# Patient Record
Sex: Female | Born: 2000 | Race: White | Hispanic: No | Marital: Single | Smoking: Current every day smoker
Health system: Southern US, Community
[De-identification: ages and names within clinical notes are randomized; demographics above are authoritative.]

---

## 2018-08-10 ENCOUNTER — Other Ambulatory Visit: Payer: Self-pay | Admitting: Chiropractic Medicine

## 2018-08-10 DIAGNOSIS — M9901 Segmental and somatic dysfunction of cervical region: Secondary | ICD-10-CM

## 2018-08-12 ENCOUNTER — Ambulatory Visit
Admission: RE | Admit: 2018-08-12 | Discharge: 2018-08-12 | Disposition: A | Discharge status: Home / Self Care | Payer: BC Managed Care – PPO | Source: Ambulatory Visit | Attending: Chiropractic Medicine | Admitting: Chiropractic Medicine

## 2018-08-12 DIAGNOSIS — M9901 Segmental and somatic dysfunction of cervical region: Secondary | ICD-10-CM

## 2019-09-09 ENCOUNTER — Emergency Department: Payer: BC Managed Care – PPO

## 2019-09-09 ENCOUNTER — Encounter: Payer: Self-pay | Admitting: Emergency Medicine

## 2019-09-09 ENCOUNTER — Inpatient Hospital Stay
Admission: EM | Admit: 2019-09-09 | Discharge: 2019-09-11 | DRG: 872 | Disposition: A | Discharge status: Home / Self Care | Payer: BC Managed Care – PPO | Attending: Internal Medicine | Admitting: Internal Medicine

## 2019-09-09 ENCOUNTER — Other Ambulatory Visit: Payer: Self-pay

## 2019-09-09 DIAGNOSIS — R000 Tachycardia, unspecified: Secondary | ICD-10-CM | POA: Diagnosis present

## 2019-09-09 DIAGNOSIS — R112 Nausea with vomiting, unspecified: Secondary | ICD-10-CM | POA: Diagnosis present

## 2019-09-09 DIAGNOSIS — A419 Sepsis, unspecified organism: Secondary | ICD-10-CM | POA: Diagnosis not present

## 2019-09-09 DIAGNOSIS — N12 Tubulo-interstitial nephritis, not specified as acute or chronic: Secondary | ICD-10-CM

## 2019-09-09 DIAGNOSIS — Z20822 Contact with and (suspected) exposure to covid-19: Secondary | ICD-10-CM | POA: Diagnosis present

## 2019-09-09 DIAGNOSIS — F17290 Nicotine dependence, other tobacco product, uncomplicated: Secondary | ICD-10-CM | POA: Diagnosis present

## 2019-09-09 DIAGNOSIS — E876 Hypokalemia: Secondary | ICD-10-CM | POA: Diagnosis present

## 2019-09-09 DIAGNOSIS — N10 Acute tubulo-interstitial nephritis: Secondary | ICD-10-CM | POA: Diagnosis present

## 2019-09-09 DIAGNOSIS — Z87440 Personal history of urinary (tract) infections: Secondary | ICD-10-CM

## 2019-09-09 DIAGNOSIS — E860 Dehydration: Secondary | ICD-10-CM | POA: Diagnosis present

## 2019-09-09 DIAGNOSIS — E871 Hypo-osmolality and hyponatremia: Secondary | ICD-10-CM | POA: Diagnosis present

## 2019-09-09 DIAGNOSIS — N390 Urinary tract infection, site not specified: Secondary | ICD-10-CM | POA: Diagnosis present

## 2019-09-09 LAB — CBC WITH DIFFERENTIAL/PLATELET
Abs Immature Granulocytes: 0.04 10*3/uL (ref 0.00–0.07)
Basophils Absolute: 0 10*3/uL (ref 0.0–0.1)
Basophils Relative: 0 %
Eosinophils Absolute: 0 10*3/uL (ref 0.0–0.5)
Eosinophils Relative: 0 %
HCT: 35.8 % — ABNORMAL LOW (ref 36.0–46.0)
Hemoglobin: 12.5 g/dL (ref 12.0–15.0)
Immature Granulocytes: 0 %
Lymphocytes Relative: 11 %
Lymphs Abs: 1.4 10*3/uL (ref 0.7–4.0)
MCH: 29.8 pg (ref 26.0–34.0)
MCHC: 34.9 g/dL (ref 30.0–36.0)
MCV: 85.2 fL (ref 80.0–100.0)
Monocytes Absolute: 1.2 10*3/uL — ABNORMAL HIGH (ref 0.1–1.0)
Monocytes Relative: 9 %
Neutro Abs: 10.3 10*3/uL — ABNORMAL HIGH (ref 1.7–7.7)
Neutrophils Relative %: 80 %
Platelets: 141 10*3/uL — ABNORMAL LOW (ref 150–400)
RBC: 4.2 MIL/uL (ref 3.87–5.11)
RDW: 12.2 % (ref 11.5–15.5)
WBC: 12.9 10*3/uL — ABNORMAL HIGH (ref 4.0–10.5)
nRBC: 0 % (ref 0.0–0.2)

## 2019-09-09 LAB — COMPREHENSIVE METABOLIC PANEL
ALT: 35 U/L (ref 0–44)
AST: 33 U/L (ref 15–41)
Albumin: 4.2 g/dL (ref 3.5–5.0)
Alkaline Phosphatase: 77 U/L (ref 38–126)
Anion gap: 11 (ref 5–15)
BUN: 10 mg/dL (ref 6–20)
CO2: 21 mmol/L — ABNORMAL LOW (ref 22–32)
Calcium: 9 mg/dL (ref 8.9–10.3)
Chloride: 102 mmol/L (ref 98–111)
Creatinine, Ser: 0.83 mg/dL (ref 0.44–1.00)
GFR calc Af Amer: >60 mL/min (ref >60)
GFR calc non Af Amer: >60 mL/min (ref >60)
Glucose, Bld: 113 mg/dL — ABNORMAL HIGH (ref 70–99)
Potassium: 3.3 mmol/L — ABNORMAL LOW (ref 3.5–5.1)
Sodium: 134 mmol/L — ABNORMAL LOW (ref 135–145)
Total Bilirubin: 1.4 mg/dL — ABNORMAL HIGH (ref 0.3–1.2)
Total Protein: 7.4 g/dL (ref 6.5–8.1)

## 2019-09-09 LAB — URINALYSIS, COMPLETE (UACMP) WITH MICROSCOPIC
Bilirubin Urine: NEGATIVE
Glucose, UA: NEGATIVE mg/dL
Ketones, ur: 5 mg/dL — AB
Nitrite: NEGATIVE
Protein, ur: 100 mg/dL — AB
Specific Gravity, Urine: 1.012 (ref 1.005–1.030)
WBC, UA: >50 WBC/hpf — ABNORMAL HIGH (ref 0–5)
pH: 5 (ref 5.0–8.0)

## 2019-09-09 LAB — PROTIME-INR
INR: 1.1 (ref 0.8–1.2)
Prothrombin Time: 13.9 seconds (ref 11.4–15.2)

## 2019-09-09 LAB — LACTIC ACID, PLASMA: Lactic Acid, Venous: 0.9 mmol/L (ref 0.5–1.9)

## 2019-09-09 LAB — RESPIRATORY PANEL BY RT PCR (FLU A&B, COVID)
Influenza A by PCR: NEGATIVE
Influenza B by PCR: NEGATIVE
SARS Coronavirus 2 by RT PCR: NEGATIVE

## 2019-09-09 LAB — TROPONIN I (HIGH SENSITIVITY): Troponin I (High Sensitivity): 3 ng/L (ref <18)

## 2019-09-09 MED ORDER — POTASSIUM CHLORIDE IN NACL 40-0.9 MEQ/L-% IV SOLN
INTRAVENOUS | Status: AC
  Administered 2019-09-10 (×2): 125 mL/h via INTRAVENOUS
  Administered 2019-09-10 – 2019-09-11 (×2): 100 mL/h via INTRAVENOUS
  Filled 2019-09-09 (×4): qty 1000

## 2019-09-09 MED ORDER — SODIUM CHLORIDE 0.9 % IV SOLN
1.0000 g | INTRAVENOUS | Status: DC
  Administered 2019-09-10: 1 g via INTRAVENOUS
  Filled 2019-09-09: qty 10
  Filled 2019-09-09: qty 1

## 2019-09-09 MED ORDER — SODIUM CHLORIDE 0.9 % IV SOLN
1.0000 g | Freq: Once | INTRAVENOUS | Status: AC
  Administered 2019-09-09: 1 g via INTRAVENOUS
  Filled 2019-09-09: qty 10

## 2019-09-09 MED ORDER — SODIUM CHLORIDE 0.9 % IV BOLUS
1000.0000 mL | Freq: Once | INTRAVENOUS | Status: AC
  Administered 2019-09-09: 21:00:00 1000 mL via INTRAVENOUS

## 2019-09-09 MED ORDER — ACETAMINOPHEN 325 MG PO TABS
650.0000 mg | ORAL_TABLET | Freq: Four times a day (QID) | ORAL | Status: DC | PRN
  Administered 2019-09-10 – 2019-09-11 (×4): 650 mg via ORAL
  Filled 2019-09-09 (×4): qty 2

## 2019-09-09 MED ORDER — ONDANSETRON HCL 4 MG/2ML IJ SOLN
4.0000 mg | Freq: Three times a day (TID) | INTRAMUSCULAR | Status: DC | PRN
  Administered 2019-09-10: 4 mg via INTRAVENOUS
  Filled 2019-09-09: qty 2

## 2019-09-09 MED ORDER — ONDANSETRON HCL 4 MG/2ML IJ SOLN
4.0000 mg | Freq: Once | INTRAMUSCULAR | Status: AC
  Administered 2019-09-09: 4 mg via INTRAVENOUS
  Filled 2019-09-09: qty 2

## 2019-09-09 MED ORDER — ENOXAPARIN SODIUM 40 MG/0.4ML ~~LOC~~ SOLN
40.0000 mg | SUBCUTANEOUS | Status: DC
  Administered 2019-09-10 – 2019-09-11 (×2): 40 mg via SUBCUTANEOUS
  Filled 2019-09-09 (×2): qty 0.4

## 2019-09-09 MED ORDER — SODIUM CHLORIDE 0.9 % IV BOLUS
1000.0000 mL | Freq: Once | INTRAVENOUS | Status: AC
  Administered 2019-09-09: 1000 mL via INTRAVENOUS

## 2019-09-09 MED ORDER — ACETAMINOPHEN 500 MG PO TABS
1000.0000 mg | ORAL_TABLET | Freq: Once | ORAL | Status: AC
  Administered 2019-09-09: 1000 mg via ORAL
  Filled 2019-09-09: qty 2

## 2019-09-09 MED ORDER — ACETAMINOPHEN 650 MG RE SUPP: 650.0000 mg | Freq: Four times a day (QID) | RECTAL | Status: DC | PRN

## 2019-09-09 MED ORDER — OXYCODONE HCL 5 MG PO TABS
5.0000 mg | ORAL_TABLET | Freq: Once | ORAL | Status: AC
  Administered 2019-09-09: 5 mg via ORAL
  Filled 2019-09-09: qty 1

## 2019-09-09 MED ORDER — OXYCODONE HCL 5 MG PO TABS
5.0000 mg | ORAL_TABLET | Freq: Four times a day (QID) | ORAL | Status: DC | PRN
  Administered 2019-09-10 – 2019-09-11 (×3): 5 mg via ORAL
  Filled 2019-09-09 (×3): qty 1

## 2019-09-09 MED ORDER — INFLUENZA VAC SPLIT QUAD 0.5 ML IM SUSY: 0.5000 mL | PREFILLED_SYRINGE | INTRAMUSCULAR | Status: DC

## 2019-09-09 NOTE — ED Triage Notes (Signed)
Pt states that she was diagnosed with a UTI today at urgent care. Pt reports not being able to keep down anything including her antibiotic. Pt states that she was given Cipro. Pt is febrile and tachycardic at this time in triage.

## 2019-09-09 NOTE — H&P (Signed)
History and Physical        Hospital Admission Note Date: 09/09/2019  Patient name: Deborah Mcfarland Medical record number: [id number] Date of birth: 2000-11-20 Age: 19 y.o. Gender: female  PCP: Coralyn Helling, MD    Patient coming from: Home   I have reviewed all records in the [facility].    Chief Complaint:  Fever and UTI   HPI: Deborah Mcfarland is 19 y.o. female presenting to ER for fever and nausea/vomiting with recent diagnosis of UTI. Reports for past two days she has had dysuria, frequency and urgency. Went to doctor today and was given an antibiotic for UTI. However, she was unable to take it because she had recurrent vomiting. Fever to 103 degrees at home. Reports pain around her kidneys. Has a history of UTIs. Last one about 6 months ago. Reports having several. Never had a work up for this. Always resolves with antibiotics.    ED work-up/course:  Found to be tachycardic with temperature of 100.3. UA concerning for infection and dehydration. WBC 12.9. No lactic acidosis. Due to recurrent emesis, given IV Zofran and IV CTX. 2L NS IVF bolus given. Urine culture obtained.   Review of Systems: Positives marked in 'bold' Constitutional: Denies fever, chills, diaphoresis, poor appetite and fatigue.  HEENT: Denies photophobia, eye pain, redness, hearing loss, ear pain, congestion, sore throat, rhinorrhea, sneezing, mouth sores, trouble swallowing, neck pain, neck stiffness and tinnitus.   Respiratory: Denies SOB, DOE, cough, chest tightness,  and wheezing.   Cardiovascular: Denies chest pain, palpitations and leg swelling.  Gastrointestinal: Denies nausea, vomiting, abdominal pain, diarrhea, constipation, blood in stool and abdominal distention.  Genitourinary: Denies dysuria, urgency, frequency, hematuria, flank pain and difficulty urinating.  Musculoskeletal: Denies myalgias, back pain, joint swelling, arthralgias  and gait problem.  Skin: Denies pallor, rash and wound.  Neurological: Denies dizziness, seizures, syncope, weakness, light-headedness, numbness and headaches.  Hematological: Denies adenopathy. Easy bruising, personal or family bleeding history  Psychiatric/Behavioral: Denies suicidal ideation, mood changes, confusion, nervousness, sleep disturbance and agitation  Past Medical History: History reviewed. No pertinent past medical history.  History reviewed. No pertinent surgical history.  Medications: Prior to Admission medications   Medication Sig Start Date End Date Taking? Authorizing Provider  Cholecalciferol 50 MCG (2000 UT) CAPS Take 2,000 Units by mouth once a week.   Yes [provider]  ciprofloxacin (CIPRO) 500 MG tablet Take 500 mg by mouth 2 (two) times daily. 09/09/19  Yes [provider]  ibuprofen (ADVIL) 200 MG tablet Take 400 mg by mouth every 6 (six) hours as needed for fever.   Yes [provider]  sertraline (ZOLOFT) 50 MG tablet Take 50 mg by mouth daily.   Yes [provider]    Allergies:  No Known Allergies  Social History:  reports that she has been smoking e-cigarettes. She has never used smokeless tobacco. She reports that she does not drink alcohol or use drugs.  Family History: No family history on file.  Physical Exam: Blood pressure 103/66, pulse 86, temperature 100.3 F (37.9 C), temperature source Oral, resp. rate (!) 21, height 5\' 2"  (1.575 m), weight 52.6 kg, last menstrual period  08/26/2019, SpO2 98 %. General: Alert, awake, oriented x3, in no acute distress. Eyes: pink conjunctiva,anicteric sclera, pupils equal and reactive to light and accomodation, HEENT: normocephalic, atraumatic, oropharynx clear Neck: supple, no masses or lymphadenopathy, no goiter, no bruits, no JVD CVS: Mildly tachycardic and regular rhythm, without murmurs, rubs or gallops. No lower extremity edema Resp : Clear to auscultation  bilaterally, no wheezing, rales or rhonchi. GI : Soft, nontender, nondistended, positive bowel sounds, no masses. No hepatomegaly. No hernia.  Musculoskeletal: No clubbing or cyanosis, positive pedal pulses. No contracture. ROM intact. TTP over left flank.  Neuro: Grossly intact, no focal neurological deficits, strength 5/5 upper and lower extremities bilaterally Psych: alert and oriented x 3, normal mood and affect Skin: no rashes or lesions, warm and dry   LABS on Admission: I have personally reviewed all the labs and imagings below    Basic Metabolic Panel: Recent Labs  Lab 09/09/19 1930  NA 134*  K 3.3*  CL 102  CO2 21*  GLUCOSE 113*  BUN 10  CREATININE 0.83  CALCIUM 9.0   Liver Function Tests: Recent Labs  Lab 09/09/19 1930  AST 33  ALT 35  ALKPHOS 77  BILITOT 1.4*  PROT 7.4  ALBUMIN 4.2   No results for input(s): LIPASE, AMYLASE in the last 168 hours. No results for input(s): AMMONIA in the last 168 hours. CBC: Recent Labs  Lab 09/09/19 1930  WBC 12.9*  NEUTROABS 10.3*  HGB 12.5  HCT 35.8*  MCV 85.2  PLT 141*   Cardiac Enzymes: No results for input(s): CKTOTAL, CKMB, CKMBINDEX, TROPONINI in the last 168 hours. BNP: Invalid input(s): POCBNP CBG: No results for input(s): GLUCAP in the last 168 hours.  Radiological Exams on Admission:  DG Chest 2 View  Result Date: 09/09/2019 CLINICAL DATA:  Suspected sepsis. Urinary tract infection at urgent care. EXAM: CHEST - 2 VIEW COMPARISON:  None. FINDINGS: The cardiomediastinal contours are normal. The lungs are clear. Pulmonary vasculature is normal. No consolidation, pleural effusion, or pneumothorax. No acute osseous abnormalities are seen. IMPRESSION: Negative radiographs of the chest. Electronically Signed   By: Narda Rutherford M.D.   On: 09/09/2019 19:43      EKG: Independently reviewed. Sinus tachycardia.    Assessment/Plan Active Problems:   Pyelonephritis   Nausea and vomiting   Tachycardia    Hypokalemia   Hyponatremia  Pyelonephritis  Patient presenting with UTI symptoms; unable to take PO antibiotic prescribed today. Tachycardic at presentation, improved after 2L NS IVFs. T 100.3. Mild leukocytosis 12.9. No lactic acidosis. UA with large leukocytes, 11-20 RBCs, >50 WBCs, rare bacteria. No evidence of postrenal AKI that would suggest obstruction. Given last UTI 6 months ago, does not suggest she has had an antibiotic treatment failure.  -observation MedSurg  -given CTX 1g in ED, continue q24h  -Zofran prn  -Tylenol for pain/antipyretic; Oxycodone 4 mg q6h prn severe pain  -urine culture pending -blood culture pending  -switch to PO antibiotics pending ability to tolerate PO and sensitivities  -patient would likely benefit from outpatient urology given report of frequent UTIs   Hypokalemia/Hyponatremia  Mild. Na 134. K 3.3. Likely related to GI losses with vomiting.  -NS 125 cc hr with 40 mEq KCl x12 hrs  -repeat BMET in AM    DVT prophylaxis: Lovenox   CODE STATUS: FULL   Consults called: None   Family Communication: Admission, patients condition and plan of care including tests being ordered have been discussed with the patient who indicates  understanding and agree with the plan and Code Status. Declines me speaking to her family on her behalf.   Admission status: Observation   The medical decision making on this patient was of high complexity and the patient is at high risk for clinical deterioration, therefore this is a level 3 admission.  Severity of Illness:     Moderate  The appropriate patient status for this patient is OBSERVATION. Observation status is judged to be reasonable and necessary in order to provide the required intensity of service to ensure the patient's safety. The patient's presenting symptoms, physical exam findings, and initial radiographic and laboratory data in the context of their medical condition is felt to place them at decreased risk for  further clinical deterioration. Furthermore, it is anticipated that the patient will be medically stable for discharge from the hospital within 2 midnights of admission. The following factors support the patient status of observation.   " The patient's presenting symptoms include dysuria, flank pain, vomiting, fevers. " The physical exam findings include tachycardia, flank pain. " The initial radiographic and laboratory data are WBC 12.9, K 3.2, Na 134, UA concerning for infection.     Time Spent on Admission: 38 minutes      De Hollingshead D.O.  [facility] 09/09/2019, 10:52 PM

## 2019-09-09 NOTE — ED Provider Notes (Addendum)
[facility] Emergency Department Provider Note  ____________________________________________   First MD Initiated Contact with Patient 09/09/19 2030     (approximate)  I have reviewed the triage vital signs and the nursing notes.  History  Chief Complaint Fever and Urinary Tract Infection    HPI Deborah Mcfarland is a 19 y.o. female no significant medical hx who presents for UTI, nausea, vomiting. Patient states she has had cloudy urine, dysuria for several days now. Today, developed nausea, vomiting, chills, and low back pain. Went to UC and diagnosed with UTI and started on PO antibiotics but has been unable to keep anything down due to vomiting. Denies any chest pain, cough, SOB, or sick contacts. Has hx of pyelonephritis and states this feels similar, though less severe than prior episode. Reports bilateral flank pain, sharp/stabbing, moderate in severity, no radiation, no alleviating/aggravating components.    Past Medical Hx History reviewed. No pertinent past medical history.  Problem List There are no problems to display for this patient.   Past Surgical Hx History reviewed. No pertinent surgical history.  Medications Prior to Admission medications   Not on File    Allergies Patient has no known allergies.  Family Hx No family history on file.  Social Hx Social History   Tobacco Use  . Smoking status: Current Every Day Smoker    Types: E-cigarettes  . Smokeless tobacco: Never Used  Substance Use Topics  . Alcohol use: Never  . Drug use: Never     Review of Systems  Constitutional: + for fever, chills. Eyes: Negative for visual changes. ENT: Negative for sore throat. Cardiovascular: Negative for chest pain. Respiratory: Negative for shortness of breath. Gastrointestinal: Negative for nausea, vomiting.  Genitourinary: + for dysuria. Musculoskeletal: Negative for leg swelling. Skin: Negative for rash. Neurological: Negative  for headaches.   Physical Exam  Vital Signs: ED Triage Vitals  Enc Vitals Group     BP 09/09/19 1917 102/60     Pulse Rate 09/09/19 1917 (!) 134     Resp 09/09/19 1917 (!) 22     Temp 09/09/19 1917 100.3 F (37.9 C)     Temp Source 09/09/19 1917 Oral     SpO2 09/09/19 1917 (!) 2 %     Weight 09/09/19 1918 116 lb (52.6 kg)     Height 09/09/19 1918 5\' 2"  (1.575 m)     Head Circumference --      Peak Flow --      Pain Score 09/09/19 1917 7     Pain Loc --      Pain Edu? --      Excl. in GC? --     Constitutional: Alert and oriented.  Head: Normocephalic. Atraumatic. Eyes: Conjunctivae clear. Sclera anicteric. Nose: No congestion. No rhinorrhea. Mouth/Throat: Wearing mask.  Neck: No stridor.   Cardiovascular: Tachycardic, regular rhythm. Extremities well perfused. Respiratory: Normal respiratory effort.  Lungs CTAB. Gastrointestinal: Soft. Non-tender. Non-distended. Bilateral CVA tenderness. Musculoskeletal: No lower extremity edema. No deformities. Neurologic:  Normal speech and language. No gross focal neurologic deficits are appreciated.  Skin: Skin is warm, dry and intact. No rash noted. Psychiatric: Mood and affect are appropriate for situation.  EKG  Personally reviewed.   Rate: 123 Rhythm: sinus Axis: normal Intervals: WNL TWI inferiorly No STEMI    Radiology  CXR: IMPRESSION:  Negative radiographs of the chest.    Procedures  Procedure(s) performed (including critical care):  Procedures   Initial Impression / Assessment and Plan /  ED Course  19 y.o. female who presents to the ED for UTI, n/v, fever, unable to keep down PO abx  Ddx: UTI, pyelonephritis, sepsis, COVID  Temp 100.3 and tachycardic on arrival. Labs, fluids, antipyretics ordered.   UA overwhelmingly positive for infection. Lactic WNL. WBC 12.9. IVF and abx ordered. HR and temperature improving. Will plan to admit for IV abx, further symptom control to ensure successful  transition to PO.    Final Clinical Impression(s) / ED Diagnosis  Final diagnoses:  Pyelonephritis       Note:  This document was prepared using Dragon voice recognition software and may include unintentional dictation errors.     Lilia Pro., MD 09/10/19 [phone]

## 2019-09-09 NOTE — ED Notes (Signed)
First Nurse Note: Pt to ED for vomiting. Pt dx with UTI at urgent care but unable to keep it down.

## 2019-09-09 NOTE — ED Notes (Signed)
Attempt to call report, according to staff on floor need to obtain "a set of complete vital signs" before report can be given. Will do as requested and call back.

## 2019-09-10 DIAGNOSIS — E876 Hypokalemia: Secondary | ICD-10-CM | POA: Diagnosis present

## 2019-09-10 DIAGNOSIS — R112 Nausea with vomiting, unspecified: Secondary | ICD-10-CM

## 2019-09-10 DIAGNOSIS — A419 Sepsis, unspecified organism: Secondary | ICD-10-CM | POA: Diagnosis present

## 2019-09-10 DIAGNOSIS — E871 Hypo-osmolality and hyponatremia: Secondary | ICD-10-CM

## 2019-09-10 DIAGNOSIS — R000 Tachycardia, unspecified: Secondary | ICD-10-CM

## 2019-09-10 DIAGNOSIS — N10 Acute tubulo-interstitial nephritis: Secondary | ICD-10-CM | POA: Diagnosis present

## 2019-09-10 DIAGNOSIS — Z87440 Personal history of urinary (tract) infections: Secondary | ICD-10-CM | POA: Diagnosis not present

## 2019-09-10 DIAGNOSIS — Z20822 Contact with and (suspected) exposure to covid-19: Secondary | ICD-10-CM | POA: Diagnosis present

## 2019-09-10 DIAGNOSIS — E860 Dehydration: Secondary | ICD-10-CM | POA: Diagnosis present

## 2019-09-10 DIAGNOSIS — F17290 Nicotine dependence, other tobacco product, uncomplicated: Secondary | ICD-10-CM | POA: Diagnosis present

## 2019-09-10 DIAGNOSIS — N12 Tubulo-interstitial nephritis, not specified as acute or chronic: Secondary | ICD-10-CM | POA: Diagnosis not present

## 2019-09-10 LAB — CBC
HCT: 33.2 % — ABNORMAL LOW (ref 36.0–46.0)
Hemoglobin: 11.1 g/dL — ABNORMAL LOW (ref 12.0–15.0)
MCH: 29.8 pg (ref 26.0–34.0)
MCHC: 33.4 g/dL (ref 30.0–36.0)
MCV: 89.2 fL (ref 80.0–100.0)
Platelets: 125 10*3/uL — ABNORMAL LOW (ref 150–400)
RBC: 3.72 MIL/uL — ABNORMAL LOW (ref 3.87–5.11)
RDW: 12.5 % (ref 11.5–15.5)
WBC: 10 10*3/uL (ref 4.0–10.5)
nRBC: 0 % (ref 0.0–0.2)

## 2019-09-10 LAB — BASIC METABOLIC PANEL
Anion gap: 6 (ref 5–15)
BUN: 9 mg/dL (ref 6–20)
CO2: 26 mmol/L (ref 22–32)
Calcium: 7.9 mg/dL — ABNORMAL LOW (ref 8.9–10.3)
Chloride: 107 mmol/L (ref 98–111)
Creatinine, Ser: 0.63 mg/dL (ref 0.44–1.00)
GFR calc Af Amer: >60 mL/min (ref >60)
GFR calc non Af Amer: >60 mL/min (ref >60)
Glucose, Bld: 105 mg/dL — ABNORMAL HIGH (ref 70–99)
Potassium: 3.8 mmol/L (ref 3.5–5.1)
Sodium: 139 mmol/L (ref 135–145)

## 2019-09-10 MED ORDER — PROMETHAZINE HCL 25 MG/ML IJ SOLN
12.5000 mg | Freq: Four times a day (QID) | INTRAMUSCULAR | Status: DC | PRN
  Administered 2019-09-10: 18:00:00 12.5 mg via INTRAVENOUS
  Filled 2019-09-10: qty 1

## 2019-09-10 MED ORDER — ONDANSETRON HCL 4 MG/2ML IJ SOLN
4.0000 mg | Freq: Four times a day (QID) | INTRAMUSCULAR | Status: DC | PRN
  Administered 2019-09-10: 4 mg via INTRAVENOUS
  Filled 2019-09-10: qty 2

## 2019-09-10 MED ORDER — MORPHINE SULFATE (PF) 2 MG/ML IV SOLN: 1.0000 mg | INTRAVENOUS | Status: DC | PRN

## 2019-09-10 NOTE — Progress Notes (Signed)
PROGRESS NOTE    Deborah Mcfarland  IDP:[id number] DOB: [birth year] DOA: 09/09/2019 PCP: Chauncey Fischer, MD   Brief Narrative:  Deborah Mcfarland is 19 y.o. female presenting to ER for fever and nausea/vomiting with recent diagnosis of UTI. Reports for past two days she has had dysuria, frequency and urgency. Went to doctor today and was given an antibiotic for UTI. However, she was unable to take it because she had recurrent vomiting. Fever to 103 degrees at home. Reports pain around her kidneys. Has a history of UTIs. Last one about 6 months ago. Reports having several. Never had a work up for this. Always resolves with antibiotics.    ED work-up/course:  Found to be tachycardic with temperature of 100.3. UA concerning for infection and dehydration. WBC 12.9. No lactic acidosis. Due to recurrent emesis, given IV Zofran and IV CTX. 2L NS IVF bolus given. Urine culture obtained.    Assessment & Plan:   Active Problems:   Pyelonephritis   Nausea and vomiting   Tachycardia   Hypokalemia   Hyponatremia   Pyelonephritis  As noted on admission, patient presenting with UTI symptoms; unable to take PO antibiotic prescribed today. Tachycardic at presentation, improved after 2L NS IVFs. T 100.3. Mild leukocytosis 12.9. No lactic acidosis. UA with large leukocytes, 11-20 RBCs, >50 WBCs, rare bacteria. No evidence of postrenal AKI that would suggest obstruction. -Inpatient admission MedSurg secondary to required IV antibiotics and IV pain medications with persistent nausea vomiting abdominal pain -given CTX 1g in ED, continue q24h  -Zofran prn  -Tylenol for pain/antipyretic; Oxycodone 4 mg q6h prn severe pain  -urine culture pending -blood culture negative  -patient would likely benefit from outpatient urology given report of frequent UTIs   Hypokalemia/Hyponatremia  Mild. Na 134. K 3.3. Likely related to GI losses with vomiting.  -NS 125 cc hr with 40 mEq KCl x12 hrs  -repeat BMET in AM     DVT prophylaxis: Lovenox SQ  Code Status: Full code    Code Status Orders  (From admission, onward)         Start     Ordered   09/10/19 0000  Full code  Continuous     09/09/19 2359        Code Status History    This patient has a current code status but no historical code status.   Advance Care Planning Activity     Family Communication: Discussed in detail with patient Disposition Plan:   Patient was changed to inpatient requiring IV antiemetics for persistent nausea and vomiting in the setting of pyelonephritis.  Patient also required fluid resuscitation secondary to decreased p.o. intake. Consults called: None Admission status: Inpatient   Consultants:   None  Procedures:  DG Chest 2 View  Result Date: 09/09/2019 CLINICAL DATA:  Suspected sepsis. Urinary tract infection at urgent care. EXAM: CHEST - 2 VIEW COMPARISON:  None. FINDINGS: The cardiomediastinal contours are normal. The lungs are clear. Pulmonary vasculature is normal. No consolidation, pleural effusion, or pneumothorax. No acute osseous abnormalities are seen. IMPRESSION: Negative radiographs of the chest. Electronically Signed   By: Narda Rutherford M.D.   On: 09/09/2019 19:43     Antimicrobials:   Ceftriaxone day #2   Subjective: Patient with nausea and vomiting overnight, Unable to keep anything down.  Objective: Vitals:   09/10/19 0602 09/10/19 0807 09/10/19 1034 09/10/19 1211  BP: 100/70 108/77 (!) 89/44 104/65  Pulse: 82 98 (!) 106 (!) 101  Resp:  18  18  Temp:  98.6 F (37 C) 100.2 F (37.9 C) 99.6 F (37.6 C)  TempSrc:  Oral Oral Oral  SpO2:  100% 99% 96%  Weight:      Height:        Intake/Output Summary (Last 24 hours) at 09/10/2019 1238 Last data filed at 09/10/2019 0602 Gross per 24 hour  Intake 1589.3 ml  Output 400 ml  Net 1189.3 ml   Filed Weights   09/09/19 1918 09/09/19 2337  Weight: 52.6 kg 54.5 kg    Examination:  General exam: Appears uncomfortable  no acute hemodynamic instability Respiratory system: Clear to auscultation. Respiratory effort normal. Cardiovascular system: S1 & S2 heard, RRR. No JVD, murmurs, rubs, gallops or clicks. No pedal edema. Gastrointestinal system: Abdomen is nondistended, soft and nontender. No organomegaly or masses felt. Normal bowel sounds heard. Central nervous system: Alert and oriented. No focal neurological deficits. Extremities: wwp, nv intact Skin: No rashes, lesions or ulcers Psychiatry: Judgement and insight appear normal. Mood & affect appropriate.     Data Reviewed: I have personally reviewed following labs and imaging studies  CBC: Recent Labs  Lab 09/09/19 1930 09/10/19 0454  WBC 12.9* 10.0  NEUTROABS 10.3*  --   HGB 12.5 11.1*  HCT 35.8* 33.2*  MCV 85.2 89.2  PLT 141* 125*   Basic Metabolic Panel: Recent Labs  Lab 09/09/19 1930 09/10/19 0454  NA 134* 139  K 3.3* 3.8  CL 102 107  CO2 21* 26  GLUCOSE 113* 105*  BUN 10 9  CREATININE 0.83 0.63  CALCIUM 9.0 7.9*   GFR: Estimated Creatinine Clearance: 88.7 mL/min (by C-G formula based on SCr of 0.63 mg/dL). Liver Function Tests: Recent Labs  Lab 09/09/19 1930  AST 33  ALT 35  ALKPHOS 77  BILITOT 1.4*  PROT 7.4  ALBUMIN 4.2   No results for input(s): LIPASE, AMYLASE in the last 168 hours. No results for input(s): AMMONIA in the last 168 hours. Coagulation Profile: Recent Labs  Lab 09/09/19 1930  INR 1.1   Cardiac Enzymes: No results for input(s): CKTOTAL, CKMB, CKMBINDEX, TROPONINI in the last 168 hours. BNP (last 3 results) No results for input(s): PROBNP in the last 8760 hours. HbA1C: No results for input(s): HGBA1C in the last 72 hours. CBG: No results for input(s): GLUCAP in the last 168 hours. Lipid Profile: No results for input(s): CHOL, HDL, LDLCALC, TRIG, CHOLHDL, LDLDIRECT in the last 72 hours. Thyroid Function Tests: No results for input(s): TSH, T4TOTAL, FREET4, T3FREE, THYROIDAB in the last 72  hours. Anemia Panel: No results for input(s): VITAMINB12, FOLATE, FERRITIN, TIBC, IRON, RETICCTPCT in the last 72 hours. Sepsis Labs: Recent Labs  Lab 09/09/19 1930  LATICACIDVEN 0.9    Recent Results (from the past 240 hour(s))  Culture, blood (Routine x 2)     Status: None (Preliminary result)   Collection Time: 09/09/19  7:30 PM   Specimen: BLOOD  Result Value Ref Range Status   Specimen Description BLOOD RIGHT FOREARM  Final   Special Requests   Final    BOTTLES DRAWN AEROBIC AND ANAEROBIC Blood Culture adequate volume   Culture   Final    NO GROWTH < 12 HOURS Performed at [facility], [address] [place], [place] [address]    Report Status PENDING  Incomplete  Culture, blood (Routine x 2)     Status: None (Preliminary result)   Collection Time: 09/09/19  8:58 PM   Specimen: BLOOD  Result Value Ref Range  Status   Specimen Description BLOOD RIGHT HAND  Final   Special Requests   Final    BOTTLES DRAWN AEROBIC AND ANAEROBIC Blood Culture adequate volume   Culture   Final    NO GROWTH < 12 HOURS Performed at [facility], [address] [place], [place] [address]    Report Status PENDING  Incomplete  Respiratory Panel by RT PCR (Flu A&B, Covid) - Nasopharyngeal Swab     Status: None   Collection Time: 09/09/19  8:58 PM   Specimen: Nasopharyngeal Swab  Result Value Ref Range Status   SARS Coronavirus 2 by RT PCR NEGATIVE NEGATIVE Final    Comment: (NOTE) SARS-CoV-2 target nucleic acids are NOT DETECTED. The SARS-CoV-2 RNA is generally detectable in upper respiratoy specimens during the acute phase of infection. The lowest concentration of SARS-CoV-2 viral copies this assay can detect is 131 copies/mL. A negative result does not preclude SARS-Cov-2 infection and should not be used as the sole basis for treatment or other patient management decisions. A negative result may occur with  improper specimen collection/handling, submission of specimen  other than nasopharyngeal swab, presence of viral mutation(s) within the areas targeted by this assay, and inadequate number of viral copies (<131 copies/mL). A negative result must be combined with clinical observations, patient history, and epidemiological information. The expected result is Negative. Fact Sheet for Patients:  PinkCheek.be Fact Sheet for Healthcare Providers:  GravelBags.it This test is not yet ap proved or cleared by the [place] FDA and  has been authorized for detection and/or diagnosis of SARS-CoV-2 by FDA under an Emergency Use Authorization (EUA). This EUA will remain  in effect (meaning this test can be used) for the duration of the COVID-19 declaration under Section 564(b)(1) of the Act, 21 U.S.C. section 360bbb-3(b)(1), unless the authorization is terminated or revoked sooner.    Influenza A by PCR NEGATIVE NEGATIVE Final   Influenza B by PCR NEGATIVE NEGATIVE Final    Comment: (NOTE) The Xpert Xpress SARS-CoV-2/FLU/RSV assay is intended as an aid in  the diagnosis of influenza from Nasopharyngeal swab specimens and  should not be used as a sole basis for treatment. Nasal washings and  aspirates are unacceptable for Xpert Xpress SARS-CoV-2/FLU/RSV  testing. Fact Sheet for Patients: PinkCheek.be Fact Sheet for Healthcare Providers: GravelBags.it This test is not yet approved or cleared by the [place] FDA and  has been authorized for detection and/or diagnosis of SARS-CoV-2 by  FDA under an Emergency Use Authorization (EUA). This EUA will remain  in effect (meaning this test can be used) for the duration of the  Covid-19 declaration under Section 564(b)(1) of the Act, 21  U.S.C. section 360bbb-3(b)(1), unless the authorization is  terminated or revoked. Performed at [facility], [address] [place], [place]  [address]          Radiology Studies: DG Chest 2 View  Result Date: 09/09/2019 CLINICAL DATA:  Suspected sepsis. Urinary tract infection at urgent care. EXAM: CHEST - 2 VIEW COMPARISON:  None. FINDINGS: The cardiomediastinal contours are normal. The lungs are clear. Pulmonary vasculature is normal. No consolidation, pleural effusion, or pneumothorax. No acute osseous abnormalities are seen. IMPRESSION: Negative radiographs of the chest. Electronically Signed   By: Keith Rake M.D.   On: 09/09/2019 19:43        Scheduled Meds: . enoxaparin (LOVENOX) injection  40 mg Subcutaneous Q24H  . influenza vac split quadrivalent PF  0.5 mL Intramuscular Tomorrow-1000   Continuous  Infusions: . 0.9 % NaCl with KCl 40 mEq / L 100 mL/hr (09/10/19 1211)  . cefTRIAXone (ROCEPHIN)  IV       LOS: 0 days    Time spent: 35 min    Burke Keels, MD [facility]  If 7PM-7AM, please contact night-coverage  09/10/2019, 12:38 PM

## 2019-09-11 DIAGNOSIS — N10 Acute tubulo-interstitial nephritis: Secondary | ICD-10-CM

## 2019-09-11 DIAGNOSIS — N390 Urinary tract infection, site not specified: Secondary | ICD-10-CM

## 2019-09-11 DIAGNOSIS — A419 Sepsis, unspecified organism: Principal | ICD-10-CM

## 2019-09-11 HISTORY — DX: Acute pyelonephritis: N10

## 2019-09-11 HISTORY — DX: Sepsis, unspecified organism: A41.9

## 2019-09-11 HISTORY — DX: Urinary tract infection, site not specified: N39.0

## 2019-09-11 LAB — CBC WITH DIFFERENTIAL/PLATELET
Abs Immature Granulocytes: 0.04 10*3/uL (ref 0.00–0.07)
Basophils Absolute: 0 10*3/uL (ref 0.0–0.1)
Basophils Relative: 0 %
Eosinophils Absolute: 0 10*3/uL (ref 0.0–0.5)
Eosinophils Relative: 0 %
HCT: 31.9 % — ABNORMAL LOW (ref 36.0–46.0)
Hemoglobin: 10.6 g/dL — ABNORMAL LOW (ref 12.0–15.0)
Immature Granulocytes: 0 %
Lymphocytes Relative: 30 %
Lymphs Abs: 2.7 10*3/uL (ref 0.7–4.0)
MCH: 29.6 pg (ref 26.0–34.0)
MCHC: 33.2 g/dL (ref 30.0–36.0)
MCV: 89.1 fL (ref 80.0–100.0)
Monocytes Absolute: 0.9 10*3/uL (ref 0.1–1.0)
Monocytes Relative: 10 %
Neutro Abs: 5.3 10*3/uL (ref 1.7–7.7)
Neutrophils Relative %: 60 %
Platelets: 128 10*3/uL — ABNORMAL LOW (ref 150–400)
RBC: 3.58 MIL/uL — ABNORMAL LOW (ref 3.87–5.11)
RDW: 12.6 % (ref 11.5–15.5)
WBC: 9 10*3/uL (ref 4.0–10.5)
nRBC: 0 % (ref 0.0–0.2)

## 2019-09-11 LAB — BASIC METABOLIC PANEL
Anion gap: 7 (ref 5–15)
BUN: 5 mg/dL — ABNORMAL LOW (ref 6–20)
CO2: 24 mmol/L (ref 22–32)
Calcium: 8.2 mg/dL — ABNORMAL LOW (ref 8.9–10.3)
Chloride: 109 mmol/L (ref 98–111)
Creatinine, Ser: 0.68 mg/dL (ref 0.44–1.00)
GFR calc Af Amer: >60 mL/min (ref >60)
GFR calc non Af Amer: >60 mL/min (ref >60)
Glucose, Bld: 104 mg/dL — ABNORMAL HIGH (ref 70–99)
Potassium: 4.2 mmol/L (ref 3.5–5.1)
Sodium: 140 mmol/L (ref 135–145)

## 2019-09-11 LAB — URINE CULTURE: Culture: NO GROWTH

## 2019-09-11 LAB — POCT PREGNANCY, URINE: Preg Test, Ur: NEGATIVE

## 2019-09-11 MED ORDER — CEPHALEXIN 500 MG PO CAPS: 500.0000 mg | ORAL_CAPSULE | Freq: Two times a day (BID) | ORAL | 0 refills | Status: AC

## 2019-09-11 NOTE — Progress Notes (Signed)
Deborah Mcfarland to be D/C'd Home per MD order.  Discussed prescriptions and follow up appointments with the patient. Prescriptions given to patient, medication list explained in detail. Pt verbalized understanding.  Allergies as of 09/11/2019   No Known Allergies     Medication List    STOP taking these medications   ciprofloxacin 500 MG tablet Commonly known as: CIPRO     TAKE these medications   cephALEXin 500 MG capsule Commonly known as: KEFLEX Take 1 capsule (500 mg total) by mouth 2 (two) times daily for 8 days.   Cholecalciferol 50 MCG (2000 UT) Caps Take 2,000 Units by mouth once a week.   ibuprofen 200 MG tablet Commonly known as: ADVIL Take 400 mg by mouth every 6 (six) hours as needed for fever.   sertraline 50 MG tablet Commonly known as: ZOLOFT Take 50 mg by mouth daily.       Vitals:   09/11/19 0826 09/11/19 0829  BP: (!) 88/55 96/60  Pulse: 74 65  Resp: 16 16  Temp: 98.4 F (36.9 C)   SpO2: 100% 100%    Skin clean, dry and intact without evidence of skin break down, no evidence of skin tears noted. IV catheter discontinued intact. Site without signs and symptoms of complications. Dressing and pressure applied. Pt denies pain at this time. No complaints noted.  An After Visit Summary was printed and given to the patient. Patient D/C home via private auto.  [name] 09/11/2019 12:59 PM

## 2019-09-11 NOTE — Discharge Instructions (Signed)
Pyelonephritis, Adult  Pyelonephritis is an infection that occurs in the kidney. The kidneys are organs that help clean the blood by moving waste out of the blood and into the pee (urine). This infection can happen quickly, or it can last for a long time. In most cases, it clears up with treatment and does not cause other problems. What are the causes? This condition may be caused by:  Germs (bacteria) going from the bladder up to the kidney. This may happen after having a bladder infection.  Germs going from the blood to the kidney. What increases the risk? This condition is more likely to develop in:  Pregnant women.  Older people.  People who have any of these conditions: ? Diabetes. ? Inflammation of the prostate gland (prostatitis), in males. ? Kidney stones or bladder stones. ? Other problems with the kidney or the parts of your body that carry pee from the kidneys to the bladder (ureters). ? Cancer.  People who have a small, thin tube (catheter) placed in the bladder.  People who are sexually active.  Women who use a medicine that kills sperm (spermicide) to prevent pregnancy.  People who have had a prior urinary tract infection (UTI). What are the signs or symptoms? Symptoms of this condition include:  Peeing often.  A strong urge to pee right away.  Burning or stinging when peeing.  Belly pain.  Back pain.  Pain in the side (flank area).  Fever or chills.  Blood in the pee, or dark pee.  Feeling sick to your stomach (nauseous) or throwing up (vomiting). How is this treated? This condition may be treated by:  Taking antibiotic medicines by mouth (orally).  Drinking enough fluids. If the infection is bad, you may need to stay in the hospital. You may be given antibiotics and fluids that are put directly into a vein through an IV tube. In some cases, other treatments may be needed. Follow these instructions at home: Medicines  Take your antibiotic  medicine as told by your doctor. Do not stop taking the antibiotic even if you start to feel better.  Take over-the-counter and prescription medicines only as told by your doctor. General instructions   Drink enough fluid to keep your pee pale yellow.  Avoid caffeine, tea, and carbonated drinks.  Pee (urinate) often. Avoid holding in pee for long periods of time.  Pee before and after sex.  After pooping (having a bowel movement), women should wipe from front to back. Use each tissue only once.  Keep all follow-up visits as told by your doctor. This is important. Contact a doctor if:  You do not feel better after 2 days.  Your symptoms get worse.  You have a fever. Get help right away if:  You cannot take your medicine or drink fluids as told.  You have chills and shaking.  You throw up.  You have very bad pain in your side or back.  You feel very weak or you pass out (faint). Summary  Pyelonephritis is an infection that occurs in the kidney.  In most cases, this infection clears up with treatment and does not cause other problems.  Take your antibiotic medicine as told by your doctor. Do not stop taking the antibiotic even if you start to feel better.  Drink enough fluid to keep your pee pale yellow. This information is not intended to replace advice given to you by your health care provider. Make sure you discuss any questions you have with  your health care provider. Document Revised: 05/10/2018 Document Reviewed: 05/10/2018 Elsevier Patient Education  2020 [employer]    Urinary Tract Infection, Adult  A urinary tract infection (UTI) is an infection of any part of the urinary tract. The urinary tract includes the kidneys, ureters, bladder, and urethra. These organs make, store, and get rid of urine in the body. Your health care provider may use other names to describe the infection. An upper UTI affects the ureters and kidneys (pyelonephritis). A lower UTI  affects the bladder (cystitis) and urethra (urethritis). What are the causes? Most urinary tract infections are caused by bacteria in your genital area, around the entrance to your urinary tract (urethra). These bacteria grow and cause inflammation of your urinary tract. What increases the risk? You are more likely to develop this condition if:  You have a urinary catheter that stays in place (indwelling).  You are not able to control when you urinate or have a bowel movement (you have incontinence).  You are female and you: ? Use a spermicide or diaphragm for birth control. ? Have low estrogen levels. ? Are pregnant.  You have certain genes that increase your risk (genetics).  You are sexually active.  You take antibiotic medicines.  You have a condition that causes your flow of urine to slow down, such as: ? An enlarged prostate, if you are female. ? Blockage in your urethra (stricture). ? A kidney stone. ? A nerve condition that affects your bladder control (neurogenic bladder). ? Not getting enough to drink, or not urinating often.  You have certain medical conditions, such as: ? Diabetes. ? A weak disease-fighting system (immunesystem). ? Sickle cell disease. ? Gout. ? Spinal cord injury. What are the signs or symptoms? Symptoms of this condition include:  Needing to urinate right away (urgently).  Frequent urination or passing small amounts of urine frequently.  Pain or burning with urination.  Blood in the urine.  Urine that smells bad or unusual.  Trouble urinating.  Cloudy urine.  Vaginal discharge, if you are female.  Pain in the abdomen or the lower back. You may also have:  Vomiting or a decreased appetite.  Confusion.  Irritability or tiredness.  A fever.  Diarrhea. The first symptom in older adults may be confusion. In some cases, they may not have any symptoms until the infection has worsened. How is this diagnosed? This condition is  diagnosed based on your medical history and a physical exam. You may also have other tests, including:  Urine tests.  Blood tests.  Tests for sexually transmitted infections (STIs). If you have had more than one UTI, a cystoscopy or imaging studies may be done to determine the cause of the infections. How is this treated? Treatment for this condition includes:  Antibiotic medicine.  Over-the-counter medicines to treat discomfort.  Drinking enough water to stay hydrated. If you have frequent infections or have other conditions such as a kidney stone, you may need to see a health care provider who specializes in the urinary tract (urologist). In rare cases, urinary tract infections can cause sepsis. Sepsis is a life-threatening condition that occurs when the body responds to an infection. Sepsis is treated in the hospital with IV antibiotics, fluids, and other medicines. Follow these instructions at home:  Medicines  Take over-the-counter and prescription medicines only as told by your health care provider.  If you were prescribed an antibiotic medicine, take it as told by your health care provider. Do not stop using  the antibiotic even if you start to feel better. General instructions  Make sure you: ? Empty your bladder often and completely. Do not hold urine for long periods of time. ? Empty your bladder after sex. ? Wipe from front to back after a bowel movement if you are female. Use each tissue one time when you wipe.  Drink enough fluid to keep your urine pale yellow.  Keep all follow-up visits as told by your health care provider. This is important. Contact a health care provider if:  Your symptoms do not get better after 1-2 days.  Your symptoms go away and then return. Get help right away if you have:  Severe pain in your back or your lower abdomen.  A fever.  Nausea or vomiting. Summary  A urinary tract infection (UTI) is an infection of any part of the  urinary tract, which includes the kidneys, ureters, bladder, and urethra.  Most urinary tract infections are caused by bacteria in your genital area, around the entrance to your urinary tract (urethra).  Treatment for this condition often includes antibiotic medicines.  If you were prescribed an antibiotic medicine, take it as told by your health care provider. Do not stop using the antibiotic even if you start to feel better.  Keep all follow-up visits as told by your health care provider. This is important. This information is not intended to replace advice given to you by your health care provider. Make sure you discuss any questions you have with your health care provider. Document Revised: 06/23/2018 Document Reviewed: 01/13/2018 Elsevier Patient Education  2020 [employer].

## 2019-09-11 NOTE — Discharge Summary (Signed)
Physician Discharge Summary  Deborah Mcfarland QZR:[id number] DOB: [birth year] DOA: 09/09/2019  PCP: Chauncey Fischer, MD  Admit date: 09/09/2019 Discharge date: 09/11/2019  Admitted From: Home Disposition: Home  Recommendations for Outpatient Follow-up:  1. Follow up with PCP in 1-2 weeks 2. Patient will complete total 10-day course of antibiotic after 3/1  Home Health: None Equipment/Devices: None  Discharge Condition: Fair CODE STATUS: Full code Diet recommendation: Regular    Discharge Diagnoses:  Principal Problem:   Acute pyelonephritis  Active Problems:   Nausea and vomiting   Tachycardia   Hypokalemia   Hyponatremia   Sepsis secondary to UTI [facility])  Brief narrative/HPI 19 year old female presented to the ED with ongoing nausea and vomiting associated with dysuria, flank pain, urinary frequency and urgency.  She was prescribed ciprofloxacin by her PCP for UTI but could not keep the antibiotic due to nausea and vomiting. She reports fever 103F at home and reports having frequent UTIs (last episode was 6 months ago).  Reports UTI to be triggered frequently after sexual intercourse.  Reports monogamous relationship with her boyfriend and had a protracted intercourse prior to her current symptoms. In the ED patient was tachycardic and had fever of 100.3 F,.  Clinically appeared dehydrated.  UA positive for UTI and WBC of 12.9K.  Also had low normal blood pressure.  Admitted for UTI with acute pyelonephritis. Patient tested negative for COVID-19.  Hospital course  Principal problem Sepsis secondary to UTI [facility]) Acute pyelonephritis Admitted to medical floor with IV hydration (received bolus on admission), empiric IV Rocephin, supportive care with pain medication and antiemetic. Patient has improved clinically and sepsis resolved.  No further flank pain or urinary symptoms.  Nausea and vomiting resolved and tolerating diet.  Afebrile past 24 hours and blood pressure stable as  well.  Blood culture and urine culture without any growth.  I will discharge her on oral Keflex to complete 10-day course of antibiotic empirically.  Provided instructions on keeping herself hydrated, frequent urination and protected intercourse with pre and post coital bladder emptying in order to prevent recurrent UTIs.   Active problems  Hypokalemia/hyponatremia Secondary to GI loss.  Replenished.  Hypotension Secondary to sepsis Blood pressure soft but asymptomatic.  Received IV fluids.  Encouraged p.o. fluid intake.  Procedure: None Family communication: None at bedside Disposition: Home   Discharge Instructions   Allergies as of 09/11/2019   No Known Allergies     Medication List    STOP taking these medications   ciprofloxacin 500 MG tablet Commonly known as: CIPRO     TAKE these medications   cephALEXin 500 MG capsule Commonly known as: KEFLEX Take 1 capsule (500 mg total) by mouth 2 (two) times daily for 8 days.   Cholecalciferol 50 MCG (2000 UT) Caps Take 2,000 Units by mouth once a week.   ibuprofen 200 MG tablet Commonly known as: ADVIL Take 400 mg by mouth every 6 (six) hours as needed for fever.   sertraline 50 MG tablet Commonly known as: ZOLOFT Take 50 mg by mouth daily.      Follow-up Information    Bean, Delila Pereyra, MD. Schedule an appointment as soon as possible for a visit in 1 week(s).   Specialty: Family Medicine Contact information: [address] [place] [address] [phone]          No Known Allergies    Procedures/Studies: DG Chest 2 View  Result Date: 09/09/2019 CLINICAL DATA:  Suspected sepsis. Urinary tract infection at urgent  care. EXAM: CHEST - 2 VIEW COMPARISON:  None. FINDINGS: The cardiomediastinal contours are normal. The lungs are clear. Pulmonary vasculature is normal. No consolidation, pleural effusion, or pneumothorax. No acute osseous abnormalities are seen. IMPRESSION: Negative radiographs of the  chest. Electronically Signed   By: Keith Rake M.D.   On: 09/09/2019 19:43     Subjective: No further nausea or vomiting.  Feels better and tolerating diet.  Denies any dysuria or urinary frequency/urgency.  Discharge Exam: Vitals:   09/11/19 0826 09/11/19 0829  BP: (!) 88/55 96/60  Pulse: 74 65  Resp: 16 16  Temp: 98.4 F (36.9 C)   SpO2: 100% 100%   Vitals:   09/10/19 2009 09/11/19 0427 09/11/19 0826 09/11/19 0829  BP: 99/60 (!) 89/58 (!) 88/55 96/60  Pulse: 99 73 74 65  Resp: 18 16 16 16   Temp: 99.5 F (37.5 C) 99.3 F (37.4 C) 98.4 F (36.9 C)   TempSrc: Oral Oral Oral   SpO2: 98% 98% 100% 100%  Weight:      Height:        General: Young female not in distress HEENT: Moist mucosa, supple neck Chest: Clear bilaterally CVs: S1-S2, GI: Soft, nondistended, nontender, no CVA tenderness Musculoskeletal: Warm, no edema    The results of significant diagnostics from this hospitalization (including imaging, microbiology, ancillary and laboratory) are listed below for reference.     Microbiology: Recent Results (from the past 240 hour(s))  Culture, blood (Routine x 2)     Status: None (Preliminary result)   Collection Time: 09/09/19  7:30 PM   Specimen: BLOOD  Result Value Ref Range Status   Specimen Description BLOOD RIGHT FOREARM  Final   Special Requests   Final    BOTTLES DRAWN AEROBIC AND ANAEROBIC Blood Culture adequate volume   Culture   Final    NO GROWTH 2 DAYS Performed at [facility], [address] [place], [place] [address]    Report Status PENDING  Incomplete  Urine Culture     Status: None   Collection Time: 09/09/19  8:50 PM   Specimen: Urine, Random  Result Value Ref Range Status   Specimen Description   Final    URINE, RANDOM Performed at [facility], [address] [place], [place] [address]    Special Requests   Final    NONE Performed at [facility], [address] [place], [place] [address]     Culture   Final    NO GROWTH Performed at [facility], [employer] [address] [place], [place] [address]    Report Status 09/11/2019 FINAL  Final  Culture, blood (Routine x 2)     Status: None (Preliminary result)   Collection Time: 09/09/19  8:58 PM   Specimen: BLOOD  Result Value Ref Range Status   Specimen Description BLOOD RIGHT HAND  Final   Special Requests   Final    BOTTLES DRAWN AEROBIC AND ANAEROBIC Blood Culture adequate volume   Culture   Final    NO GROWTH 2 DAYS Performed at [facility], [address] [place], [place] [address]    Report Status PENDING  Incomplete  Respiratory Panel by RT PCR (Flu A&B, Covid) - Nasopharyngeal Swab     Status: None   Collection Time: 09/09/19  8:58 PM   Specimen: Nasopharyngeal Swab  Result Value Ref Range Status   SARS Coronavirus 2 by RT PCR NEGATIVE NEGATIVE Final    Comment: (NOTE) SARS-CoV-2  target nucleic acids are NOT DETECTED. The SARS-CoV-2 RNA is generally detectable in upper respiratoy specimens during the acute phase of infection. The lowest concentration of SARS-CoV-2 viral copies this assay can detect is 131 copies/mL. A negative result does not preclude SARS-Cov-2 infection and should not be used as the sole basis for treatment or other patient management decisions. A negative result may occur with  improper specimen collection/handling, submission of specimen other than nasopharyngeal swab, presence of viral mutation(s) within the areas targeted by this assay, and inadequate number of viral copies (<131 copies/mL). A negative result must be combined with clinical observations, patient history, and epidemiological information. The expected result is Negative. Fact Sheet for Patients:  https://www.moore.com/ Fact Sheet for Healthcare Providers:  https://www.young.biz/ This test is not yet ap proved or cleared by the [place] FDA and  has been authorized for  detection and/or diagnosis of SARS-CoV-2 by FDA under an Emergency Use Authorization (EUA). This EUA will remain  in effect (meaning this test can be used) for the duration of the COVID-19 declaration under Section 564(b)(1) of the Act, 21 U.S.C. section 360bbb-3(b)(1), unless the authorization is terminated or revoked sooner.    Influenza A by PCR NEGATIVE NEGATIVE Final   Influenza B by PCR NEGATIVE NEGATIVE Final    Comment: (NOTE) The Xpert Xpress SARS-CoV-2/FLU/RSV assay is intended as an aid in  the diagnosis of influenza from Nasopharyngeal swab specimens and  should not be used as a sole basis for treatment. Nasal washings and  aspirates are unacceptable for Xpert Xpress SARS-CoV-2/FLU/RSV  testing. Fact Sheet for Patients: https://www.moore.com/ Fact Sheet for Healthcare Providers: https://www.young.biz/ This test is not yet approved or cleared by the [place] FDA and  has been authorized for detection and/or diagnosis of SARS-CoV-2 by  FDA under an Emergency Use Authorization (EUA). This EUA will remain  in effect (meaning this test can be used) for the duration of the  Covid-19 declaration under Section 564(b)(1) of the Act, 21  U.S.C. section 360bbb-3(b)(1), unless the authorization is  terminated or revoked. Performed at [facility], [address] [place], [place] [address]      Labs: BNP (last 3 results) No results for input(s): BNP in the last 8760 hours. Basic Metabolic Panel: Recent Labs  Lab 09/09/19 1930 09/10/19 0454 09/11/19 0433  NA 134* 139 140  K 3.3* 3.8 4.2  CL 102 107 109  CO2 21* 26 24  GLUCOSE 113* 105* 104*  BUN 10 9 5*  CREATININE 0.83 0.63 0.68  CALCIUM 9.0 7.9* 8.2*   Liver Function Tests: Recent Labs  Lab 09/09/19 1930  AST 33  ALT 35  ALKPHOS 77  BILITOT 1.4*  PROT 7.4  ALBUMIN 4.2   No results for input(s): LIPASE, AMYLASE in the last 168 hours. No results for  input(s): AMMONIA in the last 168 hours. CBC: Recent Labs  Lab 09/09/19 1930 09/10/19 0454 09/11/19 0433  WBC 12.9* 10.0 9.0  NEUTROABS 10.3*  --  5.3  HGB 12.5 11.1* 10.6*  HCT 35.8* 33.2* 31.9*  MCV 85.2 89.2 89.1  PLT 141* 125* 128*   Cardiac Enzymes: No results for input(s): CKTOTAL, CKMB, CKMBINDEX, TROPONINI in the last 168 hours. BNP: Invalid input(s): POCBNP CBG: No results for input(s): GLUCAP in the last 168 hours. D-Dimer No results for input(s): DDIMER in the last 72 hours. Hgb A1c No results for input(s): HGBA1C in the last 72 hours. Lipid Profile No results for input(s): CHOL, HDL, LDLCALC, TRIG, CHOLHDL,  LDLDIRECT in the last 72 hours. Thyroid function studies No results for input(s): TSH, T4TOTAL, T3FREE, THYROIDAB in the last 72 hours.  Invalid input(s): FREET3 Anemia work up No results for input(s): VITAMINB12, FOLATE, FERRITIN, TIBC, IRON, RETICCTPCT in the last 72 hours. Urinalysis    Component Value Date/Time   COLORURINE YELLOW (A) 09/09/2019 2050   APPEARANCEUR CLOUDY (A) 09/09/2019 2050   LABSPEC 1.012 09/09/2019 2050   PHURINE 5.0 09/09/2019 2050   GLUCOSEU NEGATIVE 09/09/2019 2050   HGBUR SMALL (A) 09/09/2019 2050   BILIRUBINUR NEGATIVE 09/09/2019 2050   KETONESUR 5 (A) 09/09/2019 2050   PROTEINUR 100 (A) 09/09/2019 2050   NITRITE NEGATIVE 09/09/2019 2050   LEUKOCYTESUR LARGE (A) 09/09/2019 2050   Sepsis Labs Invalid input(s): PROCALCITONIN,  WBC,  LACTICIDVEN Microbiology Recent Results (from the past 240 hour(s))  Culture, blood (Routine x 2)     Status: None (Preliminary result)   Collection Time: 09/09/19  7:30 PM   Specimen: BLOOD  Result Value Ref Range Status   Specimen Description BLOOD RIGHT FOREARM  Final   Special Requests   Final    BOTTLES DRAWN AEROBIC AND ANAEROBIC Blood Culture adequate volume   Culture   Final    NO GROWTH 2 DAYS Performed at [facility], [address] [place], [place] [address]     Report Status PENDING  Incomplete  Urine Culture     Status: None   Collection Time: 09/09/19  8:50 PM   Specimen: Urine, Random  Result Value Ref Range Status   Specimen Description   Final    URINE, RANDOM Performed at [facility], [address], [place], [place] [address]    Special Requests   Final    NONE Performed at [facility], [address] [place], [place] [address]    Culture   Final    NO GROWTH Performed at [facility], [address], [place], [place] [address]    Report Status 09/11/2019 FINAL  Final  Culture, blood (Routine x 2)     Status: None (Preliminary result)   Collection Time: 09/09/19  8:58 PM   Specimen: BLOOD  Result Value Ref Range Status   Specimen Description BLOOD RIGHT HAND  Final   Special Requests   Final    BOTTLES DRAWN AEROBIC AND ANAEROBIC Blood Culture adequate volume   Culture   Final    NO GROWTH 2 DAYS Performed at [facility], [address], [place], [place] [address]    Report Status PENDING  Incomplete  Respiratory Panel by RT PCR (Flu A&B, Covid) - Nasopharyngeal Swab     Status: None   Collection Time: 09/09/19  8:58 PM   Specimen: Nasopharyngeal Swab  Result Value Ref Range Status   SARS Coronavirus 2 by RT PCR NEGATIVE NEGATIVE Final    Comment: (NOTE) SARS-CoV-2 target nucleic acids are NOT DETECTED. The SARS-CoV-2 RNA is generally detectable in upper respiratoy specimens during the acute phase of infection. The lowest concentration of SARS-CoV-2 viral copies this assay can detect is 131 copies/mL. A negative result does not preclude SARS-Cov-2 infection and should not be used as the sole basis for treatment or other patient management decisions. A negative result may occur with  improper specimen collection/handling, submission of specimen other than nasopharyngeal swab, presence of viral mutation(s) within the areas targeted by this assay, and inadequate number of viral  copies (<131 copies/mL). A negative result must be combined with clinical observations, patient  history, and epidemiological information. The expected result is Negative. Fact Sheet for Patients:  https://www.moore.com/ Fact Sheet for Healthcare Providers:  https://www.young.biz/ This test is not yet ap proved or cleared by the [place] FDA and  has been authorized for detection and/or diagnosis of SARS-CoV-2 by FDA under an Emergency Use Authorization (EUA). This EUA will remain  in effect (meaning this test can be used) for the duration of the COVID-19 declaration under Section 564(b)(1) of the Act, 21 U.S.C. section 360bbb-3(b)(1), unless the authorization is terminated or revoked sooner.    Influenza A by PCR NEGATIVE NEGATIVE Final   Influenza B by PCR NEGATIVE NEGATIVE Final    Comment: (NOTE) The Xpert Xpress SARS-CoV-2/FLU/RSV assay is intended as an aid in  the diagnosis of influenza from Nasopharyngeal swab specimens and  should not be used as a sole basis for treatment. Nasal washings and  aspirates are unacceptable for Xpert Xpress SARS-CoV-2/FLU/RSV  testing. Fact Sheet for Patients: https://www.moore.com/ Fact Sheet for Healthcare Providers: https://www.young.biz/ This test is not yet approved or cleared by the [place] FDA and  has been authorized for detection and/or diagnosis of SARS-CoV-2 by  FDA under an Emergency Use Authorization (EUA). This EUA will remain  in effect (meaning this test can be used) for the duration of the  Covid-19 declaration under Section 564(b)(1) of the Act, 21  U.S.C. section 360bbb-3(b)(1), unless the authorization is  terminated or revoked. Performed at [facility], [address] [place], [place] [address]      Time coordinating discharge: 35 minutes  SIGNED:   Eddie North, MD  [facility] 09/11/2019, 12:20 PM Pager    If 7PM-7AM, please contact night-coverage [email or web address] Password [email or web address]

## 2019-09-12 LAB — HIV ANTIBODY (ROUTINE TESTING W REFLEX): HIV Screen 4th Generation wRfx: NONREACTIVE — AB

## 2019-09-14 LAB — CULTURE, BLOOD (ROUTINE X 2)
Culture: NO GROWTH
Culture: NO GROWTH
Special Requests: ADEQUATE
Special Requests: ADEQUATE

## 2020-01-09 ENCOUNTER — Other Ambulatory Visit: Payer: Self-pay

## 2020-01-09 ENCOUNTER — Ambulatory Visit (INDEPENDENT_AMBULATORY_CARE_PROVIDER_SITE_OTHER): Payer: BC Managed Care – PPO | Admitting: Obstetrics

## 2020-01-09 ENCOUNTER — Encounter: Payer: Self-pay | Admitting: Obstetrics

## 2020-01-09 VITALS — BP 100/60 | Wt 119.0 lb

## 2020-01-09 DIAGNOSIS — Z3201 Encounter for pregnancy test, result positive: Secondary | ICD-10-CM | POA: Diagnosis not present

## 2020-01-09 DIAGNOSIS — Z87718 Personal history of other specified (corrected) congenital malformations of genitourinary system: Secondary | ICD-10-CM

## 2020-01-09 DIAGNOSIS — Z3480 Encounter for supervision of other normal pregnancy, unspecified trimester: Secondary | ICD-10-CM

## 2020-01-09 DIAGNOSIS — Z3A01 Less than 8 weeks gestation of pregnancy: Secondary | ICD-10-CM

## 2020-01-09 DIAGNOSIS — Z3481 Encounter for supervision of other normal pregnancy, first trimester: Secondary | ICD-10-CM

## 2020-01-09 LAB — POCT URINALYSIS DIPSTICK OB: Glucose, UA: NEGATIVE

## 2020-01-09 LAB — POCT URINE PREGNANCY: Preg Test, Ur: POSITIVE — AB

## 2020-01-09 NOTE — Progress Notes (Signed)
New Obstetric Patient H&P    Chief Complaint: "Desires prenatal care"   History of Present Illness: Patient is a 19 y.o. G1P0 Not Hispanic or Latino female, LMP 11/20/2019 presents with amenorrhea and positive home pregnancy test. Based on her  LMP, her EDD is Estimated Date of Delivery: 08/26/20 and her EGA is [redacted]w[redacted]d. Cycles are 5. days, regular, and occur approximately every : 28 days. Her last pap smear was not done previously due to her age . Marland Kitchen    She had a urine pregnancy test which was positive about 4 days day(s)  ago. Her last menstrual period was normal and lasted for  about 4 day(s). Since her LMP she claims she has experienced nausea and fatigue. She denies vaginal bleeding. 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  There are cats in the home in the home  no   She admits close contact with children on a regular basis  no  She has had chicken pox in the past unknown She has had Tuberculosis exposures, symptoms, or previously tested positive for TB   no Current or past history of domestic violence. no  Genetic Screening/Teratology Counseling: (Includes patient, baby's father, or anyone in either family with:)   1. Patient's age >/= 60 at [facility]  no 2. Thalassemia ([place], [place], Mediterranean, or Asian background): MCV<80  no 3. Neural tube defect (meningomyelocele, spina bifida, anencephaly)  no 4. Congenital heart defect  no  5. Down syndrome  no 6. Tay-Sachs (Jewish, [place]  no 7. Canavan's Disease  no 8. Sickle cell disease or trait (African)  no  9. Hemophilia or other blood disorders  no  10. Muscular dystrophy  no  11. Cystic fibrosis  no  12. Huntington's Chorea  no  13. Mental retardation/autism  no 14. Other inherited  genetic or chromosomal disorder  no 15. Maternal metabolic disorder (DM, PKU, etc)  no 16. Patient or FOB with a child with a birth defect not listed above no  16a. Patient or FOB with a birth defect themselves no 17. Recurrent pregnancy loss, or stillbirth  no  18. Any medications since LMP other than prenatal vitamins (include vitamins, supplements, OTC meds, drugs, alcohol)  No, she stopped taking her daily Zoloft, and stopped smoking cigarettes. 19. Any other genetic/environmental exposure to discuss  no  Infection History:   1. Lives with someone with TB or TB exposed  no  2. Patient or partner has history of genital herpes  no 3. Rash or viral illness since LMP  no 4. History of STI (GC, CT, HPV, syphilis, HIV)  no 5. History of recent travel :  no  Other pertinent information:  yes     Review of Systems:10 point review of systems negative unless otherwise noted in HPI  Past Medical History:  History reviewed. No pertinent past medical history.  Past Surgical History:  History reviewed. No pertinent surgical history.  Gynecologic History: Patient's last menstrual period was 11/20/2019.  Obstetric History: G1P0  Family History:  History reviewed. No pertinent family history.  Social History:  Social History   Socioeconomic History  . Marital status: Single    Spouse name: Not on file  .  Number of children: Not on file  . Years of education: Not on file  . Highest education level: Not on file  Occupational History  . Not on file  Tobacco Use  . Smoking status: Current Every Day Smoker    Types: E-cigarettes  . Smokeless tobacco: Never Used  Vaping Use  . Vaping Use: Former  Substance and Sexual Activity  . Alcohol use: Never  . Drug use: Never  . Sexual activity: Yes    Birth control/protection: None  Other Topics Concern  . Not on file  Social History Narrative  . Not on file   Social Determinants of Health   Financial Resource Strain:   .  Difficulty of Paying Living Expenses:   Food Insecurity:   . Worried About Programme researcher, broadcasting/film/video in the Last Year:   . Barista in the Last Year:   Transportation Needs:   . Freight forwarder (Medical):   Marland Kitchen Lack of Transportation (Non-Medical):   Physical Activity:   . Days of Exercise per Week:   . Minutes of Exercise per Session:   Stress:   . Feeling of Stress :   Social Connections:   . Frequency of Communication with Friends and Family:   . Frequency of Social Gatherings with Friends and Family:   . Attends Religious Services:   . Active Member of Clubs or Organizations:   . Attends Banker Meetings:   Marland Kitchen Marital Status:   Intimate Partner Violence:   . Fear of Current or Ex-Partner:   . Emotionally Abused:   Marland Kitchen Physically Abused:   . Sexually Abused:     Allergies:  No Known Allergies  Medications: Prior to Admission medications   Medication Sig Start Date End Date Taking? Authorizing Provider  Cholecalciferol 50 MCG (2000 UT) CAPS Take 2,000 Units by mouth once a week. Patient not taking: Reported on 01/09/2020    [provider]  ibuprofen (ADVIL) 200 MG tablet Take 400 mg by mouth every 6 (six) hours as needed for fever. Patient not taking: Reported on 01/09/2020    [provider]  sertraline (ZOLOFT) 50 MG tablet Take 50 mg by mouth daily. Patient not taking: Reported on 01/09/2020    [provider]    Physical Exam Vitals: Blood pressure 100/60, weight 119 lb (54 kg), last menstrual period 11/20/2019.  General: NAD HEENT: normocephalic, anicteric Thyroid: no enlargement, no palpable nodules Pulmonary: No increased work of breathing, CTAB Cardiovascular: RRR, distal pulses 2+ Abdomen: NABS, soft, non-tender, non-distended.  Umbilicus without lesions.  No hepatomegaly, splenomegaly or masses palpable. No evidence of hernia  Genitourinary:  External: Normal external female genitalia.  Normal urethral meatus,  normal  Bartholin's and Skene's glands.    Vagina: Normal vaginal mucosa, no evidence of prolapse.    Cervix: Grossly normal in appearance, no bleeding  Uterus: anteverted Non-enlarged, mobile, normal contour.  No CMT  Adnexa: ovaries non-enlarged, no adnexal masses  Rectal: deferred Extremities: no edema, erythema, or tenderness Neurologic: Grossly intact Psychiatric: mood appropriate, affect full   Assessment: 19 y.o. G1P0 at [redacted]w[redacted]d presenting to initiate prenatal care Patient concerned about having a "T shaped uterus" and being at higher risk.   Plan: 1) Avoid alcoholic beverages. 2) Patient encouraged not to smoke.  3) Discontinue the use of all non-medicinal drugs and chemicals.  4) Take prenatal vitamins daily.  5) Nutrition, food safety (fish, cheese advisories, and high nitrite foods) and exercise discussed. 6) Hospital and practice  style discussed with cross coverage system.  7) Genetic Screening, such as with 1st Trimester Screening, cell free fetal DNA, AFP testing, and Ultrasound, as well as with amniocentesis and CVS as appropriate, is discussed with patient. At the conclusion of today's visit patient undecided genetic testing 8) Patient is asked about travel to areas at risk for the [place] virus, and counseled to avoid travel and exposure to mosquitoes or sexual partners who may have themselves been exposed to the virus. Testing is discussed, and will be ordered as appropriate.   Ultrasound ordered for next week. OB labs drawn today, including Inheritest. Pap smear performed today, cultures retrieved, urine culture ordered. To start on a daily multivitamin. The office OB packet provided to her. She will review the Fetal testing information and let the office know about further tests. She consents to Inheritest today. ROB in 1 week for dating ultrasound and ROB.  Imagene Riches, CNM  01/09/2020 6:05 PM

## 2020-01-10 LAB — RPR+RH+ABO+RUB AB+AB SCR+CB...
Antibody Screen: NEGATIVE
HIV Screen 4th Generation wRfx: NONREACTIVE
Hematocrit: 41.9 % (ref 34.0–46.6)
Hemoglobin: 14.4 g/dL (ref 11.1–15.9)
Hepatitis B Surface Ag: NEGATIVE
MCH: 30.8 pg (ref 26.6–33.0)
MCHC: 34.4 g/dL (ref 31.5–35.7)
MCV: 90 fL (ref 79–97)
Platelets: 249 10*3/uL (ref 150–450)
RBC: 4.67 x10E6/uL (ref 3.77–5.28)
RDW: 12.6 % (ref 11.7–15.4)
RPR Ser Ql: NONREACTIVE
Rh Factor: POSITIVE
Rubella Antibodies, IGG: 3.2 index (ref >0.99)
Varicella zoster IgG: 1009 index (ref >165)
WBC: 12 10*3/uL — ABNORMAL HIGH (ref 3.4–10.8)

## 2020-01-11 ENCOUNTER — Telehealth: Payer: Self-pay

## 2020-01-11 ENCOUNTER — Other Ambulatory Visit: Payer: Self-pay | Admitting: Obstetrics and Gynecology

## 2020-01-11 DIAGNOSIS — Z3480 Encounter for supervision of other normal pregnancy, unspecified trimester: Secondary | ICD-10-CM

## 2020-01-11 DIAGNOSIS — O219 Vomiting of pregnancy, unspecified: Secondary | ICD-10-CM | POA: Insufficient documentation

## 2020-01-11 MED ORDER — DOXYLAMINE-PYRIDOXINE 10-10 MG PO TBEC: DELAYED_RELEASE_TABLET | ORAL | 5 refills | Status: DC

## 2020-01-11 NOTE — Telephone Encounter (Signed)
Rx sent at 9:46pm

## 2020-01-11 NOTE — Telephone Encounter (Signed)
Pt left msg on triage line saying she saw MMF a few days ago and was told she was going to call nausea/vomiting Rx in to her pharmacy. Pharmacy does not have it. Pt is asking for rx to be sent because she is having a hard time keeping food down. Says she can keep fluids in but not food. She is stayed well hydrated. Can you pls send Rx to pharmacy?

## 2020-01-12 LAB — URINE CULTURE: Organism ID, Bacteria: NO GROWTH

## 2020-01-12 NOTE — Telephone Encounter (Signed)
Called pt, no answer, left detailed msg Rx sent to pharmacy. 

## 2020-01-15 ENCOUNTER — Other Ambulatory Visit: Payer: Self-pay | Admitting: Obstetrics

## 2020-01-15 ENCOUNTER — Other Ambulatory Visit: Payer: Self-pay

## 2020-01-15 ENCOUNTER — Ambulatory Visit (INDEPENDENT_AMBULATORY_CARE_PROVIDER_SITE_OTHER): Payer: BC Managed Care – PPO

## 2020-01-15 DIAGNOSIS — Z3201 Encounter for pregnancy test, result positive: Secondary | ICD-10-CM

## 2020-01-15 DIAGNOSIS — Z3A01 Less than 8 weeks gestation of pregnancy: Secondary | ICD-10-CM

## 2020-01-16 ENCOUNTER — Encounter: Payer: Self-pay | Admitting: Advanced Practice Midwife

## 2020-01-16 ENCOUNTER — Ambulatory Visit (INDEPENDENT_AMBULATORY_CARE_PROVIDER_SITE_OTHER): Payer: BC Managed Care – PPO | Admitting: Advanced Practice Midwife

## 2020-01-16 VITALS — BP 104/64 | Wt 127.0 lb

## 2020-01-16 DIAGNOSIS — Z3A01 Less than 8 weeks gestation of pregnancy: Secondary | ICD-10-CM

## 2020-01-16 DIAGNOSIS — Z3480 Encounter for supervision of other normal pregnancy, unspecified trimester: Secondary | ICD-10-CM

## 2020-01-16 LAB — IGP,CTNGTV,RFX APTIMA HPV ASCU
Chlamydia, Nuc. Acid Amp: NEGATIVE
Gonococcus, Nuc. Acid Amp: NEGATIVE
Trich vag by NAA: NEGATIVE

## 2020-01-16 LAB — HPV APTIMA: HPV Aptima: NEGATIVE

## 2020-01-16 NOTE — Progress Notes (Signed)
Dating scan today.  

## 2020-01-16 NOTE — Progress Notes (Signed)
  Routine Prenatal Care Visit  Subjective  Deborah Mcfarland is a 19 y.o. G1P0 at [redacted]w[redacted]d being seen today for ongoing prenatal care.  She is currently monitored for the following issues for this low-risk pregnancy and has Nausea and vomiting; Tachycardia; Hypokalemia; Hyponatremia; Acute pyelonephritis; Sepsis secondary to UTI [facility]); Supervision of other normal pregnancy, antepartum; History of uterine anomaly; and Nausea and vomiting during pregnancy on their problem list.  ----------------------------------------------------------------------------------- Patient reports no complaints.  Discussed results of dating scan yesterday.  . Vag. Bleeding: None.   . Leaking Fluid denies.  ----------------------------------------------------------------------------------- The following portions of the patient's history were reviewed and updated as appropriate: allergies, current medications, past family history, past medical history, past social history, past surgical history and problem list. Problem list updated.  Objective  Blood pressure 104/64, weight 127 lb (57.6 kg), last menstrual period 11/20/2019. Pregravid weight 119 lb (54 kg) Total Weight Gain 8 lb (3.629 kg) Urinalysis: Urine Protein    Urine Glucose    Fetal Status: Fetal Heart Rate (bpm): 141          Dating scan yesterday: 7 weeks 1 day with adjusted EDD for 6 day difference  General:  Alert, oriented and cooperative. Patient is in no acute distress.  Skin: Skin is warm and dry. No rash noted.   Cardiovascular: Normal heart rate noted  Respiratory: Normal respiratory effort, no problems with respiration noted  Abdomen: Soft, gravid, appropriate for gestational age.       Pelvic:  Cervical exam deferred        Extremities: Normal range of motion.     Mental Status: Normal mood and affect. Normal behavior. Normal judgment and thought content.   Assessment   19 y.o. G1P0 at [redacted]w[redacted]d by  09/01/2020, by Ultrasound presenting for routine  prenatal visit  Plan   pregnancy 1 Problems (from 11/20/19 to present)    Problem Noted Resolved   Nausea and vomiting during pregnancy 01/11/2020 by Conard Novak, MD No       Preterm labor symptoms and general obstetric precautions including but not limited to vaginal bleeding, contractions, leaking of fluid and fetal movement were reviewed in detail with the patient.   Return in about 3 weeks (around 02/06/2020) for rob.  Tresea Mall, CNM 01/16/2020 4:50 PM

## 2020-01-17 ENCOUNTER — Encounter: Payer: Self-pay | Admitting: Obstetrics

## 2020-01-18 ENCOUNTER — Encounter: Payer: Self-pay | Admitting: Obstetrics

## 2020-01-18 NOTE — Progress Notes (Signed)
Patien had a dating ultrasound that changes her EDD. Unable to contact the patient by phone (voicemailbox is FULL)  She has also not set up My Chart. She had been told at a young age that she had a "T shaped uterus", and that she would not be able to have children. 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  We will need to educate her on this finding at her next appointment. Mirna Mires, CNM  01/18/2020 5:00 PM

## 2020-01-21 ENCOUNTER — Other Ambulatory Visit: Payer: Self-pay

## 2020-01-21 ENCOUNTER — Other Ambulatory Visit: Payer: Self-pay | Admitting: Obstetrics and Gynecology

## 2020-01-21 ENCOUNTER — Emergency Department
Admission: EM | Admit: 2020-01-21 | Discharge: 2020-01-22 | Disposition: A | Discharge status: Home / Self Care | Payer: BC Managed Care – PPO | Attending: Student in an Organized Health Care Education/Training Program | Admitting: Student in an Organized Health Care Education/Training Program

## 2020-01-21 DIAGNOSIS — Z3A08 8 weeks gestation of pregnancy: Secondary | ICD-10-CM | POA: Diagnosis not present

## 2020-01-21 DIAGNOSIS — R1013 Epigastric pain: Secondary | ICD-10-CM | POA: Diagnosis not present

## 2020-01-21 DIAGNOSIS — F17210 Nicotine dependence, cigarettes, uncomplicated: Secondary | ICD-10-CM | POA: Diagnosis not present

## 2020-01-21 DIAGNOSIS — O219 Vomiting of pregnancy, unspecified: Secondary | ICD-10-CM

## 2020-01-21 DIAGNOSIS — O26891 Other specified pregnancy related conditions, first trimester: Secondary | ICD-10-CM | POA: Diagnosis present

## 2020-01-21 LAB — COMPREHENSIVE METABOLIC PANEL
ALT: 41 U/L (ref 0–44)
AST: 37 U/L (ref 15–41)
Albumin: 5 g/dL (ref 3.5–5.0)
Alkaline Phosphatase: 57 U/L (ref 38–126)
Anion gap: 17 — ABNORMAL HIGH (ref 5–15)
BUN: 12 mg/dL (ref 6–20)
CO2: 18 mmol/L — ABNORMAL LOW (ref 22–32)
Calcium: 10.1 mg/dL (ref 8.9–10.3)
Chloride: 101 mmol/L (ref 98–111)
Creatinine, Ser: 0.78 mg/dL (ref 0.44–1.00)
GFR calc Af Amer: >60 mL/min (ref >60)
GFR calc non Af Amer: >60 mL/min (ref >60)
Glucose, Bld: 117 mg/dL — ABNORMAL HIGH (ref 70–99)
Potassium: 3.5 mmol/L (ref 3.5–5.1)
Sodium: 136 mmol/L (ref 135–145)
Total Bilirubin: 1.2 mg/dL (ref 0.3–1.2)
Total Protein: 8.3 g/dL — ABNORMAL HIGH (ref 6.5–8.1)

## 2020-01-21 LAB — CBC WITH DIFFERENTIAL/PLATELET
Abs Immature Granulocytes: 0.04 10*3/uL (ref 0.00–0.07)
Basophils Absolute: 0 10*3/uL (ref 0.0–0.1)
Basophils Relative: 0 %
Eosinophils Absolute: 0 10*3/uL (ref 0.0–0.5)
Eosinophils Relative: 0 %
HCT: 41.6 % (ref 36.0–46.0)
Hemoglobin: 14.8 g/dL (ref 12.0–15.0)
Immature Granulocytes: 0 %
Lymphocytes Relative: 7 %
Lymphs Abs: 0.9 10*3/uL (ref 0.7–4.0)
MCH: 30.8 pg (ref 26.0–34.0)
MCHC: 35.6 g/dL (ref 30.0–36.0)
MCV: 86.7 fL (ref 80.0–100.0)
Monocytes Absolute: 0.2 10*3/uL (ref 0.1–1.0)
Monocytes Relative: 1 %
Neutro Abs: 11.9 10*3/uL — ABNORMAL HIGH (ref 1.7–7.7)
Neutrophils Relative %: 92 %
Platelets: 249 10*3/uL (ref 150–400)
RBC: 4.8 MIL/uL (ref 3.87–5.11)
RDW: 11.7 % (ref 11.5–15.5)
WBC: 13.1 10*3/uL — ABNORMAL HIGH (ref 4.0–10.5)
nRBC: 0 % (ref 0.0–0.2)

## 2020-01-21 MED ORDER — PROMETHAZINE HCL 25 MG RE SUPP: 25.0000 mg | Freq: Four times a day (QID) | RECTAL | 3 refills | Status: DC | PRN

## 2020-01-21 MED ORDER — LACTATED RINGERS IV BOLUS
1000.0000 mL | Freq: Once | INTRAVENOUS | Status: AC
  Administered 2020-01-21: 1000 mL via INTRAVENOUS

## 2020-01-21 MED ORDER — METOCLOPRAMIDE HCL 5 MG/ML IJ SOLN
10.0000 mg | Freq: Once | INTRAMUSCULAR | Status: AC
  Administered 2020-01-21: 10 mg via INTRAVENOUS
  Filled 2020-01-21: qty 2

## 2020-01-21 NOTE — ED Notes (Signed)
Pt resting in stretcher. Fluids hung as ordered, pt reports general weakness. Pt instructed to ring call bell when standing to prevent falls. Call bell within reach. Pt reports decrease in nausea since receiving medicine prior to coming to room.

## 2020-01-21 NOTE — ED Triage Notes (Signed)
Patient reports being approximately [redacted] weeks pregnant and has been vomiting all day.

## 2020-01-21 NOTE — ED Provider Notes (Signed)
[facility] Emergency Department Provider Note    First MD Initiated Contact with Patient 01/21/20 2331     (approximate)  I have reviewed the triage vital signs and the nursing notes.   HISTORY  Chief Complaint Emesis    HPI Deborah Mcfarland is a 19 y.o. female G1, P0 who is roughly [redacted] weeks pregnant by LMP presents to the ER for nausea and vomiting starting today.  States she has had some crampy epigastric pain related to the vomiting.  No hematemesis or coffee-ground emesis.  No biliary emesis.  Denies any lower abdominal pain.  Denies any cough or congestion.  No dysuria.  No vaginal discharge or bleeding.  Denies any pelvic pain.    No past medical history on file. No family history on file. No past surgical history on file. Patient Active Problem List   Diagnosis Date Noted  . Nausea and vomiting during pregnancy 01/11/2020  . Supervision of other normal pregnancy, antepartum 01/09/2020  . History of uterine anomaly 01/09/2020  . Acute pyelonephritis 09/11/2019  . Sepsis secondary to UTI (HCC) 09/11/2019  . Nausea and vomiting 09/09/2019  . Tachycardia 09/09/2019  . Hypokalemia 09/09/2019  . Hyponatremia 09/09/2019      Prior to Admission medications   Medication Sig Start Date End Date Taking? Authorizing Provider  Cholecalciferol 50 MCG (2000 UT) CAPS Take 2,000 Units by mouth once a week. Patient not taking: Reported on 01/09/2020    [provider]  Doxylamine-Pyridoxine (DICLEGIS) 10-10 MG TBEC Take 2 tabs PO QHS, If symptoms persist, add one tablet in the morning and one in the afternoon 01/11/20   Conard Novak, MD  ibuprofen (ADVIL) 200 MG tablet Take 400 mg by mouth every 6 (six) hours as needed for fever. Patient not taking: Reported on 01/09/2020    [provider]  promethazine (PHENERGAN) 25 MG suppository Place 1 suppository (25 mg total) rectally every 6 (six) hours as needed for nausea or vomiting. 01/21/20    Schuman, Jaquelyn Bitter, MD  sertraline (ZOLOFT) 50 MG tablet Take 50 mg by mouth daily. Patient not taking: Reported on 01/09/2020    [provider]    Allergies Ondansetron hcl    Social History Social History   Tobacco Use  . Smoking status: Current Every Day Smoker    Types: E-cigarettes  . Smokeless tobacco: Never Used  Vaping Use  . Vaping Use: Former  Substance Use Topics  . Alcohol use: Never  . Drug use: Never    Review of Systems Patient denies headaches, rhinorrhea, blurry vision, numbness, shortness of breath, chest pain, edema, cough, abdominal pain, nausea, vomiting, diarrhea, dysuria, fevers, rashes or hallucinations unless otherwise stated above in HPI. ____________________________________________   PHYSICAL EXAM:  VITAL SIGNS: Vitals:   01/21/20 2225 01/22/20 0030  BP: 113/60 105/65  Pulse: 61 70  Resp: 18 16  Temp: 97.6 F (36.4 C)   SpO2: 99% 100%    Constitutional: Alert and oriented.  Eyes: Conjunctivae are normal.  Head: Atraumatic. Nose: No congestion/rhinnorhea. Mouth/Throat: Mucous membranes are moist.   Neck: No stridor. Painless ROM.  Cardiovascular: Normal rate, regular rhythm. Grossly normal heart sounds.  Good peripheral circulation. Respiratory: Normal respiratory effort.  No retractions. Lungs CTAB. Gastrointestinal: Soft and nontender in all four quadrants. No distention. No abdominal bruits. No CVA tenderness. Genitourinary: deferred Musculoskeletal: No lower extremity tenderness nor edema.  No joint effusions. Neurologic:  Normal speech and language. No gross focal neurologic deficits are appreciated.  No facial droop Skin:  Skin is warm, dry and intact. No rash noted. Psychiatric: Mood and affect are normal. Speech and behavior are normal.  ____________________________________________   LABS (all labs ordered are listed, but only abnormal results are displayed)  Results for orders placed or performed during the  hospital encounter of 01/21/20 (from the past 24 hour(s))  CBC with Differential     Status: Abnormal   Collection Time: 01/21/20  8:28 PM  Result Value Ref Range   WBC 13.1 (H) 4.0 - 10.5 K/uL   RBC 4.80 3.87 - 5.11 MIL/uL   Hemoglobin 14.8 12.0 - 15.0 g/dL   HCT 26.9 36 - 46 %   MCV 86.7 80.0 - 100.0 fL   MCH 30.8 26.0 - 34.0 pg   MCHC 35.6 30.0 - 36.0 g/dL   RDW 48.5 46.2 - 70.3 %   Platelets 249 150 - 400 K/uL   nRBC 0.0 0.0 - 0.2 %   Neutrophils Relative % 92 %   Neutro Abs 11.9 (H) 1.7 - 7.7 K/uL   Lymphocytes Relative 7 %   Lymphs Abs 0.9 0.7 - 4.0 K/uL   Monocytes Relative 1 %   Monocytes Absolute 0.2 0 - 1 K/uL   Eosinophils Relative 0 %   Eosinophils Absolute 0.0 0 - 0 K/uL   Basophils Relative 0 %   Basophils Absolute 0.0 0 - 0 K/uL   Immature Granulocytes 0 %   Abs Immature Granulocytes 0.04 0.00 - 0.07 K/uL  Comprehensive metabolic panel     Status: Abnormal   Collection Time: 01/21/20  8:28 PM  Result Value Ref Range   Sodium 136 135 - 145 mmol/L   Potassium 3.5 3.5 - 5.1 mmol/L   Chloride 101 98 - 111 mmol/L   CO2 18 (L) 22 - 32 mmol/L   Glucose, Bld 117 (H) 70 - 99 mg/dL   BUN 12 6 - 20 mg/dL   Creatinine, Ser 5.00 0.44 - 1.00 mg/dL   Calcium 93.8 8.9 - 18.2 mg/dL   Total Protein 8.3 (H) 6.5 - 8.1 g/dL   Albumin 5.0 3.5 - 5.0 g/dL   AST 37 15 - 41 U/L   ALT 41 0 - 44 U/L   Alkaline Phosphatase 57 38 - 126 U/L   Total Bilirubin 1.2 0.3 - 1.2 mg/dL   GFR calc non Af Amer >60 >60 mL/min   GFR calc Af Amer >60 >60 mL/min   Anion gap 17 (H) 5 - 15  hCG, quantitative, pregnancy     Status: Abnormal   Collection Time: 01/21/20  8:28 PM  Result Value Ref Range   hCG, Beta Chain, Quant, S 993,716 (H) <5 mIU/mL  Urinalysis, Complete w Microscopic     Status: Abnormal   Collection Time: 01/22/20  1:25 AM  Result Value Ref Range   Color, Urine YELLOW (A) YELLOW   APPearance HAZY (A) CLEAR   Specific Gravity, Urine 1.031 (H) 1.005 - 1.030   pH 5.0 5.0 - 8.0     Glucose, UA NEGATIVE NEGATIVE mg/dL   Hgb urine dipstick NEGATIVE NEGATIVE   Bilirubin Urine NEGATIVE NEGATIVE   Ketones, ur 80 (A) NEGATIVE mg/dL   Protein, ur 30 (A) NEGATIVE mg/dL   Nitrite NEGATIVE NEGATIVE   Leukocytes,Ua NEGATIVE NEGATIVE   RBC / HPF 0-5 0 - 5 RBC/hpf   WBC, UA 0-5 0 - 5 WBC/hpf   Bacteria, UA NONE SEEN NONE SEEN   Squamous Epithelial / LPF 0-5 0 -  5   Mucus PRESENT    ____________________________________________ ____________________________________________  RADIOLOGY   ____________________________________________   PROCEDURES  Procedure(s) performed:  Procedures    Critical Care performed: no ____________________________________________   INITIAL IMPRESSION / ASSESSMENT AND PLAN / ED COURSE  Pertinent labs & imaging results that were available during my care of the patient were reviewed by me and considered in my medical decision making (see chart for details).   DDX: enteritis, dehydration, electrolyte abn, hyperemesis, gastritis, molar pregnancy  Deborah Mcfarland is a 19 y.o. who presents to the ED with nausea vomiting as described above..  Her abdominal exam is soft benign.  She not describing any symptoms of ectopic or miscarriage.  Will check hCG to see if it is significantly elevated suggesting a molar pregnancy will check blood work.  Will give IV fluids as well as IV Reglan.  Clinical Course as of Jan 21 201  Mon Jan 22, 2020  0201 Patient reassessed.  Feels significantly improved.  She is tolerating p.o.  Feels a little bit of nausea and has requested some additional Zofran as she does not have a prescription picked up yet but it has been sent to her pharmacy by OB/GYN.  Her blood work is otherwise reassuring.  She has no abdominal pain.  She is tolerating p.o. and has received IV fluids with improvement in symptoms I think she is appropriate for outpatient follow-up.  We discussed signs and symptoms for which she should return to the ER.    [PR]    Clinical Course User Index [PR] Willy Eddy, MD    The patient was evaluated in Emergency Department today for the symptoms described in the history of present illness. He/she was evaluated in the context of the global COVID-19 pandemic, which necessitated consideration that the patient might be at risk for infection with the SARS-CoV-2 virus that causes COVID-19. Institutional protocols and algorithms that pertain to the evaluation of patients at risk for COVID-19 are in a state of rapid change based on information released by regulatory bodies including the CDC and federal and state organizations. These policies and algorithms were followed during the patient's care in the ED.  As part of my medical decision making, I reviewed the following data within the electronic MEDICAL RECORD NUMBER Nursing notes reviewed and incorporated, Labs reviewed, notes from prior ED visits and [place] Controlled Substance Database   ____________________________________________   FINAL CLINICAL IMPRESSION(S) / ED DIAGNOSES  Final diagnoses:  Nausea and vomiting in pregnancy      NEW MEDICATIONS STARTED DURING THIS VISIT:  New Prescriptions   No medications on file     Note:  This document was prepared using Dragon voice recognition software and may include unintentional dictation errors.    Willy Eddy, MD 01/22/20 [phone]

## 2020-01-22 LAB — URINALYSIS, COMPLETE (UACMP) WITH MICROSCOPIC
Bacteria, UA: NONE SEEN
Bilirubin Urine: NEGATIVE
Glucose, UA: NEGATIVE mg/dL
Hgb urine dipstick: NEGATIVE
Ketones, ur: 80 mg/dL — AB
Leukocytes,Ua: NEGATIVE
Nitrite: NEGATIVE
Protein, ur: 30 mg/dL — AB
Specific Gravity, Urine: 1.031 — ABNORMAL HIGH (ref 1.005–1.030)
pH: 5 (ref 5.0–8.0)

## 2020-01-22 LAB — HCG, QUANTITATIVE, PREGNANCY: hCG, Beta Chain, Quant, S: 263469 m[IU]/mL — ABNORMAL HIGH (ref <5)

## 2020-01-22 MED ORDER — ONDANSETRON HCL 4 MG/2ML IJ SOLN
4.0000 mg | Freq: Once | INTRAMUSCULAR | Status: AC
  Administered 2020-01-22: 4 mg via INTRAVENOUS
  Filled 2020-01-22: qty 2

## 2020-01-22 NOTE — Discharge Instructions (Signed)
Please follow up with OB/Gyn.  Please return to the ER if you develop fever, abdominal pain, vaginal bleeding or for any additional questions or concerns.

## 2020-01-22 NOTE — ED Notes (Signed)
Pt states relief from nausea at this time. Pt able to drink ginger ale without problems. Discharge instructions reviewed with pt, no further questions at this time.

## 2020-01-23 LAB — URINE DRUG PANEL 7
Amphetamines, Urine: NEGATIVE ng/mL
Barbiturate Quant, Ur: NEGATIVE ng/mL
Benzodiazepine Quant, Ur: NEGATIVE ng/mL
Cannabinoid Quant, Ur: POSITIVE — AB
Cocaine (Metab.): NEGATIVE ng/mL
Opiate Quant, Ur: NEGATIVE ng/mL
PCP Quant, Ur: NEGATIVE ng/mL

## 2020-01-24 ENCOUNTER — Encounter: Payer: Self-pay | Admitting: Obstetrics

## 2020-02-06 ENCOUNTER — Other Ambulatory Visit: Payer: Self-pay

## 2020-02-06 ENCOUNTER — Ambulatory Visit (INDEPENDENT_AMBULATORY_CARE_PROVIDER_SITE_OTHER): Payer: BC Managed Care – PPO | Admitting: Obstetrics and Gynecology

## 2020-02-06 DIAGNOSIS — O219 Vomiting of pregnancy, unspecified: Secondary | ICD-10-CM

## 2020-02-06 DIAGNOSIS — O210 Mild hyperemesis gravidarum: Secondary | ICD-10-CM

## 2020-02-06 DIAGNOSIS — Z3480 Encounter for supervision of other normal pregnancy, unspecified trimester: Secondary | ICD-10-CM

## 2020-02-06 MED ORDER — ONDANSETRON 4 MG PO TBDP: 4.0000 mg | ORAL_TABLET | Freq: Four times a day (QID) | ORAL | 2 refills | Status: DC | PRN

## 2020-02-06 MED ORDER — METOCLOPRAMIDE HCL 10 MG PO TABS: 10.0000 mg | ORAL_TABLET | Freq: Three times a day (TID) | ORAL | 2 refills | Status: DC

## 2020-02-06 NOTE — Progress Notes (Signed)
   I connected with Deborah Mcfarland  on 02/06/20 at  2:10 PM EDT by telephone and verified that I am speaking with the correct person using two identifiers.   I discussed the limitations, risks, security and privacy concerns of performing an evaluation and management service by telephone and the availability of in person appointments. I also discussed with the patient that there may be a patient responsible charge related to this service. The patient expressed understanding and agreed to proceed.  The patient was at home I spoke with the patient from my workstation phone The names of people involved in this encounter were: Deborah Mcfarland , and Deborah Mcfarland  Routine Prenatal Care Visit  Subjective  Deborah Mcfarland is a 19 y.o. G1P0 at [redacted]w[redacted]d being seen today for ongoing prenatal care.  She is currently monitored for the following issues for this low-risk pregnancy and has Nausea and vomiting; Tachycardia; Hypokalemia; Hyponatremia; Acute pyelonephritis; Sepsis secondary to UTI [facility]); Supervision of other normal pregnancy, antepartum; History of uterine anomaly; and Nausea and vomiting during pregnancy on their problem list.  ----------------------------------------------------------------------------------- Patient reports no complaints.   Contractions: Not present. Vag. Bleeding: None.  Movement: Absent. Denies leaking of fluid.  ----------------------------------------------------------------------------------- The following portions of the patient's history were reviewed and updated as appropriate: allergies, current medications, past family history, past medical history, past social history, past surgical history and problem list. Problem list updated.   Objective  Last menstrual period 11/20/2019. Pregravid weight 119 lb (54 kg) Total Weight Gain 8 lb (3.629 kg) Urinalysis:      Fetal Status:     Movement: Absent     General:  Alert, oriented and cooperative. Patient is in no acute  distress.  Skin: Skin is warm and dry. No rash noted.   Cardiovascular: Normal heart rate noted  Respiratory: Normal respiratory effort, no problems with respiration noted  Abdomen: Soft, gravid, appropriate for gestational age. Pain/Pressure: Absent     Pelvic:  Cervical exam deferred        Extremities: Normal range of motion.     ental Status: Normal mood and affect. Normal behavior. Normal judgment and thought content.   No physical exam as this was a remote telephone visit to promote social distancing during the current COVID-19 Pandemic   Assessment   19 y.o. G1P0 at [redacted]w[redacted]d by  09/01/2020, by Ultrasound presenting for routine prenatal visit  Plan    Gestational age appropriate obstetric precautions including but not limited to vaginal bleeding, contractions, leaking of fluid and fetal movement were reviewed in detail with the patient.    1) Hyperemesis gravidarum - add reglan AC TID  - add zofran ODT 4mg  prn  2) Genetics - declines genetic screening  3) Telephone Time 10:66min  No follow-ups on file.  20m, MD, Deborah Mcfarland [facility], [facility] 02/06/2020, 1:17 PM

## 2020-03-05 ENCOUNTER — Encounter: Payer: BC Managed Care – PPO | Admitting: Obstetrics

## 2020-03-05 ENCOUNTER — Ambulatory Visit (INDEPENDENT_AMBULATORY_CARE_PROVIDER_SITE_OTHER): Payer: BC Managed Care – PPO | Admitting: Obstetrics and Gynecology

## 2020-03-05 ENCOUNTER — Other Ambulatory Visit: Payer: Self-pay

## 2020-03-05 VITALS — BP 110/70 | Wt 127.0 lb

## 2020-03-05 DIAGNOSIS — Z3482 Encounter for supervision of other normal pregnancy, second trimester: Secondary | ICD-10-CM

## 2020-03-05 DIAGNOSIS — Z3480 Encounter for supervision of other normal pregnancy, unspecified trimester: Secondary | ICD-10-CM

## 2020-03-05 DIAGNOSIS — Z3A14 14 weeks gestation of pregnancy: Secondary | ICD-10-CM

## 2020-03-05 LAB — POCT URINALYSIS DIPSTICK OB
Glucose, UA: NEGATIVE
POC,PROTEIN,UA: NEGATIVE

## 2020-03-05 NOTE — Progress Notes (Signed)
ROB- no concerns 

## 2020-03-05 NOTE — Addendum Note (Signed)
Addended by: Donnetta Hail on: 03/05/2020 09:20 AM   Modules accepted: Orders

## 2020-03-05 NOTE — Patient Instructions (Signed)
I value your feedback and entrusting us with your care. If you get a [facility] patient survey, I would appreciate you taking the time to let us know about your experience today. Thank you!  As of June 29, 2019, your lab results will be released to your MyChart immediately, before I even have a chance to see them. Please give me time to review them and contact you if there are any abnormalities. Thank you for your patience.  

## 2020-03-05 NOTE — Progress Notes (Signed)
  Subjective  Fetal Movement? no Contractions? no Leaking Fluid? no Vaginal Bleeding? no PNVs? Yes  NVP improved with zofran and phenergan supp prn (has RF). Doing well. Still declines genetic testing.  Objective  BP 110/70   Wt 127 lb (57.6 kg)   LMP 11/20/2019   BMI 23.23 kg/m  General: NAD Pulmonary: no increased work of breathing Abdomen: gravid, non-tender Extremities: no edema Psychiatric: mood appropriate, affect full  Assessment  19 y.o. G1P0 at [redacted]w[redacted]d by  09/01/2020, by Ultrasound presenting for routine prenatal visit  Plan   Problem List Items Addressed This Visit      Other   Supervision of other normal pregnancy, antepartum    Other Visit Diagnoses    [redacted] weeks gestation of pregnancy    -  Primary     Labs: none  RTO 4 weeks for ROB  Deborah Weil B. Trampas Stettner, PA-C [facility],  03/05/2020  8:50 AM

## 2020-03-14 ENCOUNTER — Telehealth: Payer: Self-pay

## 2020-03-14 NOTE — Telephone Encounter (Signed)
Pt left msg on triage requesting help with refills of her zofran and phenergan. Called [facility] (besides [facility]) and was told Aug 31st is the earliest she can pick up zofran. Advised to take Vitamin B6 and Unisome in the meantime. Phenergan suppositories are ready for pick up as of today. Pt aware.

## 2020-03-28 IMAGING — CR DG CHEST 2V
2 series · 2 of 2 positions shown · non-contrast
Comparison: None.

CLINICAL DATA: Suspected sepsis. Urinary tract infection at [HOSPITAL].

EXAM:
CHEST - 2 VIEW

[chest pa]
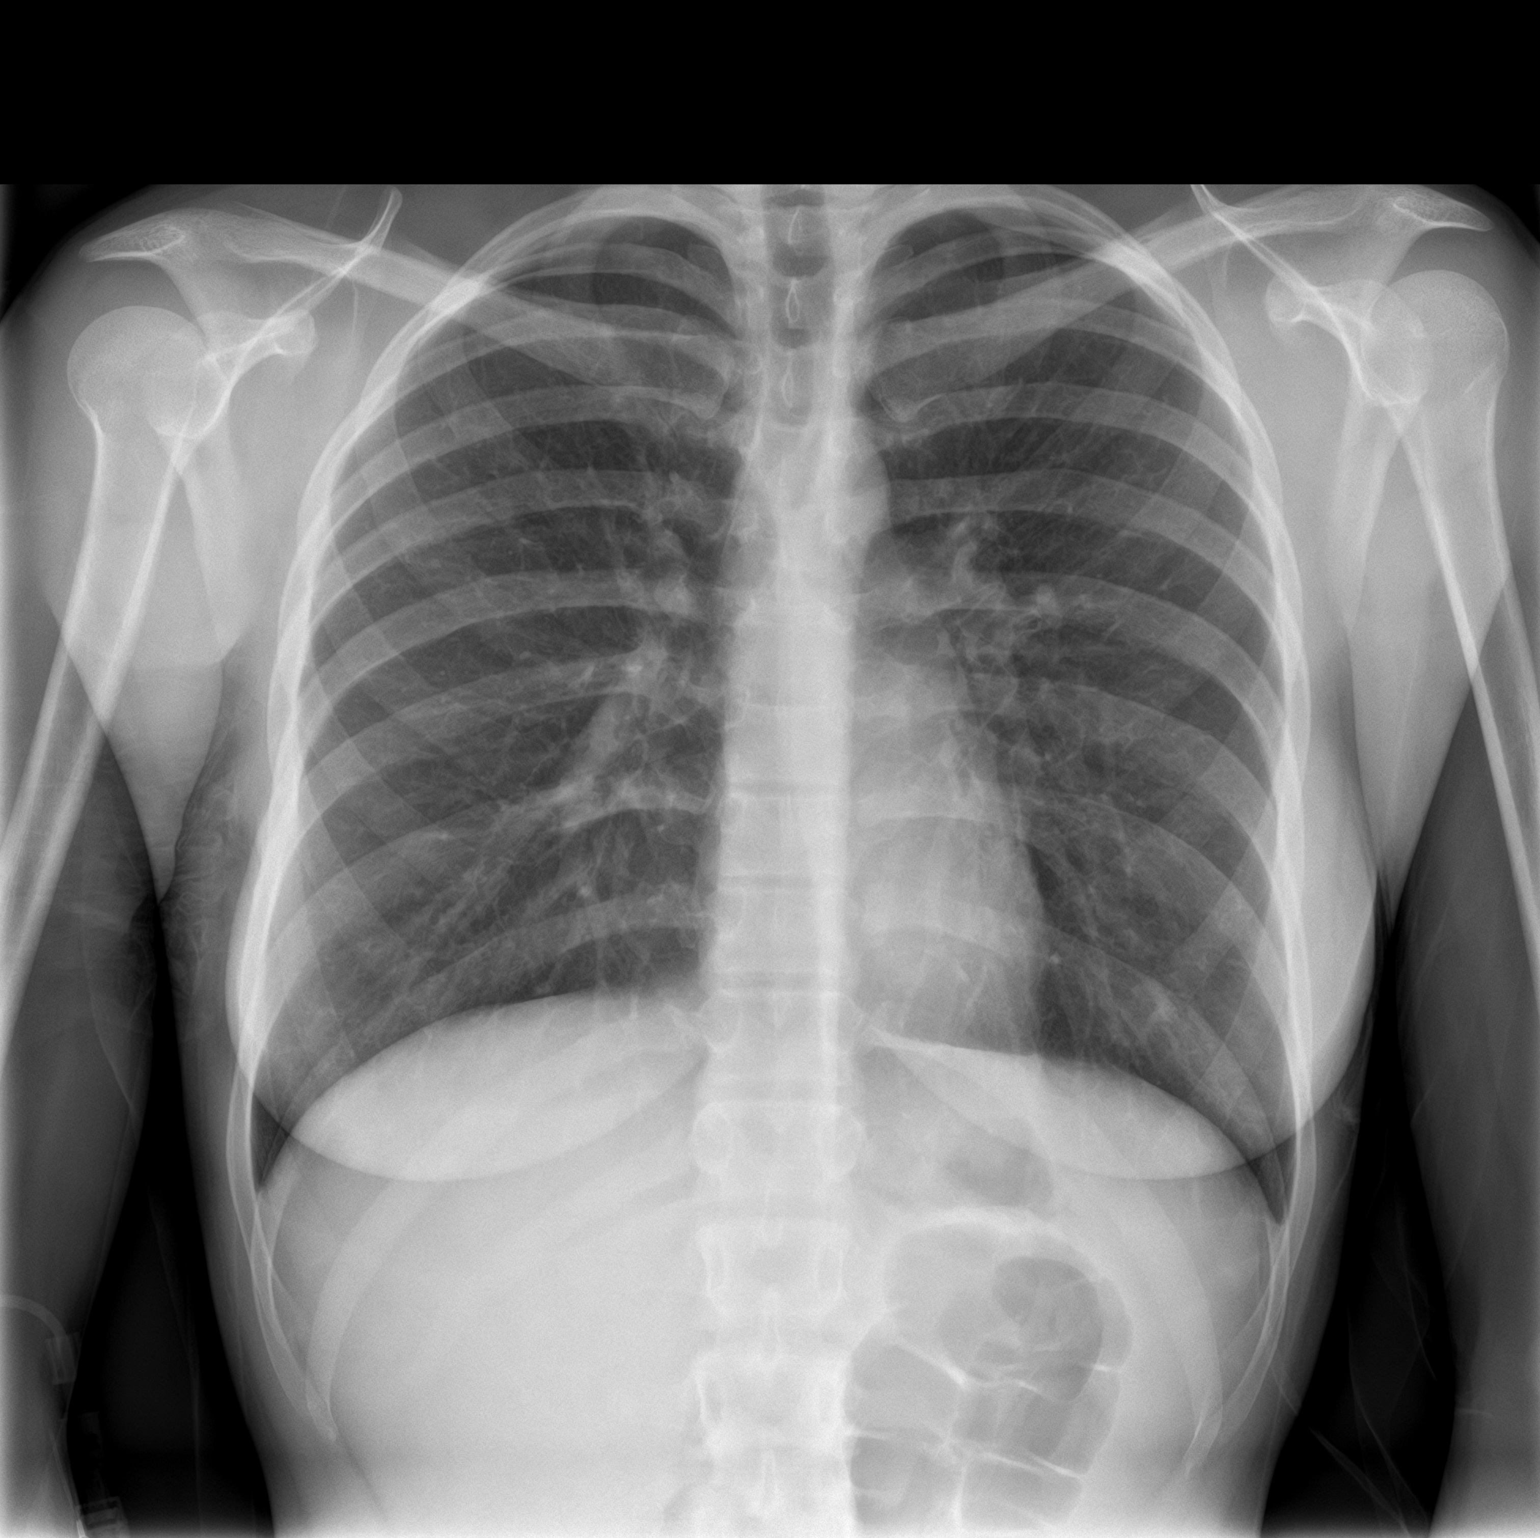

[chest lat]
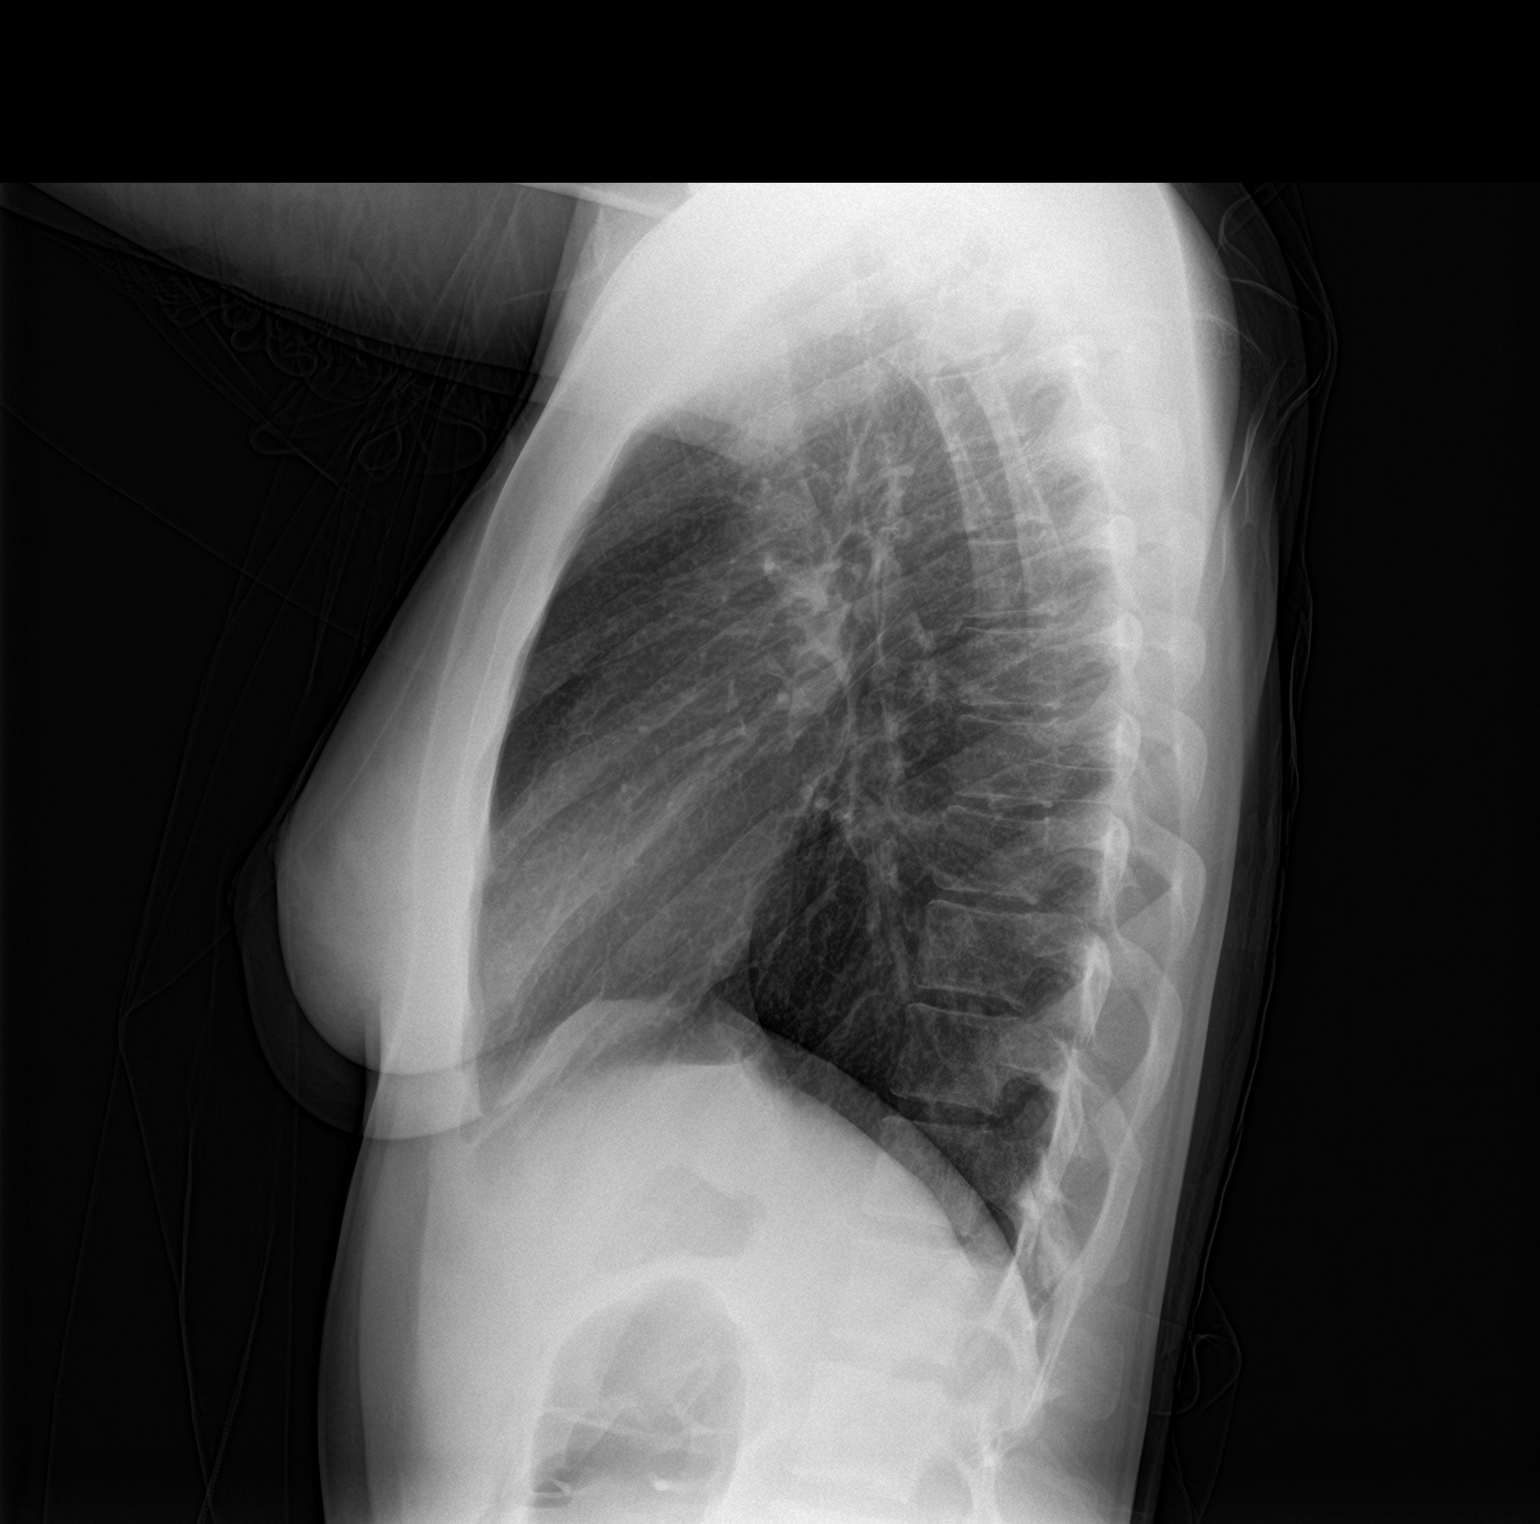

[2 of 2 positions shown; findings below may reference images not displayed]

FINDINGS: The cardiomediastinal contours are normal. The lungs are clear.
Pulmonary vasculature is normal. No consolidation, pleural effusion,
or pneumothorax. No acute osseous abnormalities are seen.
IMPRESSION: Negative radiographs of the chest.

## 2020-04-02 ENCOUNTER — Other Ambulatory Visit: Payer: Self-pay

## 2020-04-02 ENCOUNTER — Ambulatory Visit (INDEPENDENT_AMBULATORY_CARE_PROVIDER_SITE_OTHER): Payer: BC Managed Care – PPO | Admitting: Obstetrics

## 2020-04-02 VITALS — BP 100/62 | Wt 131.0 lb

## 2020-04-02 DIAGNOSIS — Z3480 Encounter for supervision of other normal pregnancy, unspecified trimester: Secondary | ICD-10-CM

## 2020-04-02 DIAGNOSIS — Z3A18 18 weeks gestation of pregnancy: Secondary | ICD-10-CM

## 2020-04-02 NOTE — Progress Notes (Signed)
ROB Cramping/pulling throughout abdomen, majority on left side. Explained about round ligament pain

## 2020-04-02 NOTE — Progress Notes (Signed)
  Routine Prenatal Care Visit  Subjective  Deborah Mcfarland is a 19 y.o. G1P0 at [redacted]w[redacted]d being seen today for ongoing prenatal care.  She is currently monitored for the following issues for this low-risk pregnancy and has Nausea and vomiting; Tachycardia; Hypokalemia; Hyponatremia; Acute pyelonephritis; Sepsis secondary to UTI [facility]); Supervision of other normal pregnancy, antepartum; History of uterine anomaly; and Nausea and vomiting during pregnancy on their problem list.  ----------------------------------------------------------------------------------- Patient reports no bleeding, no contractions, no leaking and she has felt a "pulling" sensation from time to time along the right side of her uterus.she is feelinb flutters now. 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    .  .   [name] denies.  ----------------------------------------------------------------------------------- The following portions of the patient's history were reviewed and updated as appropriate: allergies, current medications, past family history, past medical history, past social history, past surgical history and problem list. Problem list updated.  Objective  Blood pressure 100/62, weight 131 lb (59.4 kg), last menstrual period 11/20/2019. Pregravid weight 119 lb (54 kg) Total Weight Gain 12 lb (5.443 kg) Urinalysis: Urine Protein    Urine Glucose    Fetal Status:           General:  Alert, oriented and cooperative. Patient is in no acute distress.  Skin: Skin is warm and dry. No rash noted.   Cardiovascular: Normal heart rate noted  Respiratory: Normal respiratory effort, no problems with respiration noted  Abdomen: Soft, gravid, appropriate for gestational age.       Pelvic:  Cervical exam deferred        Extremities: Normal range of motion.     Mental Status: Normal mood and affect. Normal behavior. Normal judgment and thought content.   Assessment   19 y.o. G1P0 at [redacted]w[redacted]d by  09/01/2020,  by Ultrasound presenting for routine prenatal visit  Plan   pregnancy 1 Problems (from 11/20/19 to present)    Problem Noted Resolved   Nausea and vomiting during pregnancy 01/11/2020 by Conard Novak, MD No       Preterm labor symptoms and general obstetric precautions including but not limited to vaginal bleeding, contractions, leaking of fluid and fetal movement were reviewed in detail with the patient. Please refer to After Visit Summary for other counseling recommendations.  We discussed her anatomy ultrasound at next visit. Also addressed her + MJ screen and the potential risks associated with its use. She agrees to cease using.  No follow-ups on file.  Mirna Mires, CNM  04/02/2020 1:44 PM

## 2020-04-16 ENCOUNTER — Ambulatory Visit (INDEPENDENT_AMBULATORY_CARE_PROVIDER_SITE_OTHER): Payer: BC Managed Care – PPO | Admitting: Advanced Practice Midwife

## 2020-04-16 ENCOUNTER — Other Ambulatory Visit: Payer: Self-pay

## 2020-04-16 ENCOUNTER — Encounter: Payer: Self-pay | Admitting: Advanced Practice Midwife

## 2020-04-16 ENCOUNTER — Ambulatory Visit (INDEPENDENT_AMBULATORY_CARE_PROVIDER_SITE_OTHER): Payer: BC Managed Care – PPO

## 2020-04-16 VITALS — BP 118/74 | Wt 133.0 lb

## 2020-04-16 DIAGNOSIS — Z3A18 18 weeks gestation of pregnancy: Secondary | ICD-10-CM

## 2020-04-16 DIAGNOSIS — Z3480 Encounter for supervision of other normal pregnancy, unspecified trimester: Secondary | ICD-10-CM

## 2020-04-16 DIAGNOSIS — Z3A20 20 weeks gestation of pregnancy: Secondary | ICD-10-CM

## 2020-04-16 NOTE — Progress Notes (Signed)
U/s today. No vb. No lof.  

## 2020-04-16 NOTE — Progress Notes (Signed)
  Routine Prenatal Care Visit  Subjective  Deborah Mcfarland is a 19 y.o. G1P0 at [redacted]w[redacted]d being seen today for ongoing prenatal care.  She is currently monitored for the following issues for this low-risk pregnancy and has Nausea and vomiting; Tachycardia; Hypokalemia; Hyponatremia; Acute pyelonephritis; Sepsis secondary to UTI [facility]); Supervision of other normal pregnancy, antepartum; History of uterine anomaly; and Nausea and vomiting during pregnancy on their problem list.  ----------------------------------------------------------------------------------- Patient reports no complaints.  We discussed results of anatomy scan. 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 Contractions: Not present. Vag. Bleeding: None.  Movement: Present. Leaking Fluid denies.  ----------------------------------------------------------------------------------- The following portions of the patient's history were reviewed and updated as appropriate: allergies, current medications, past family history, past medical history, past social history, past surgical history and problem list. Problem list updated.  Objective  Blood pressure 118/74, weight 133 lb (60.3 kg), last menstrual period 11/20/2019. Pregravid weight 119 lb (54 kg) Total Weight Gain 14 lb (6.35 kg) Urinalysis: Urine Protein    Urine Glucose    Fetal Status: Fetal Heart Rate (bpm): 152 Fundal Height: 20 cm Movement: Present      Anatomy: complete, normal, female, breech, placenta posterior  General:  Alert, oriented and cooperative. Patient is in no acute distress.  Skin: Skin is warm and dry. No rash noted.   Cardiovascular: Normal heart rate noted  Respiratory: Normal respiratory effort, no problems with respiration noted  Abdomen: Soft, gravid, appropriate for gestational age. Pain/Pressure: Absent     Pelvic:  Cervical exam deferred        Extremities: Normal range of motion.  Edema: None  Mental Status: Normal mood and affect.  Normal behavior. Normal judgment and thought content.   Assessment   19 y.o. G1P0 at [redacted]w[redacted]d by  09/01/2020, by Ultrasound presenting for routine prenatal visit  Plan   pregnancy 1 Problems (from 11/20/19 to present)    Problem Noted Resolved   Nausea and vomiting during pregnancy 01/11/2020 by Conard Novak, MD No       Preterm labor symptoms and general obstetric precautions including but not limited to vaginal bleeding, contractions, leaking of fluid and fetal movement were reviewed in detail with the patient.   Return in about 4 weeks (around 05/14/2020) for rob.  Tresea Mall, CNM 04/16/2020 4:13 PM

## 2020-05-14 ENCOUNTER — Ambulatory Visit (INDEPENDENT_AMBULATORY_CARE_PROVIDER_SITE_OTHER): Payer: BC Managed Care – PPO | Admitting: Obstetrics and Gynecology

## 2020-05-14 ENCOUNTER — Other Ambulatory Visit: Payer: Self-pay

## 2020-05-14 VITALS — BP 120/80 | Wt 150.0 lb

## 2020-05-14 DIAGNOSIS — Z3A24 24 weeks gestation of pregnancy: Secondary | ICD-10-CM

## 2020-05-14 DIAGNOSIS — Z3480 Encounter for supervision of other normal pregnancy, unspecified trimester: Secondary | ICD-10-CM

## 2020-05-14 NOTE — Progress Notes (Signed)
Routine Prenatal Care Visit  Subjective  Deborah Mcfarland is a 19 y.o. G1P0 at [redacted]w[redacted]d being seen today for ongoing prenatal care.  She is currently monitored for the following issues for this low-risk pregnancy and has Nausea and vomiting; Tachycardia; Hypokalemia; Hyponatremia; Acute pyelonephritis; Sepsis secondary to UTI [facility]); Supervision of other normal pregnancy, antepartum; History of uterine anomaly; and Nausea and vomiting during pregnancy on their problem list.  ----------------------------------------------------------------------------------- Patient reports no complaints.   Contractions: Not present. Vag. Bleeding: None.  Movement: Present. Denies leaking of fluid.  ----------------------------------------------------------------------------------- The following portions of the patient's history were reviewed and updated as appropriate: allergies, current medications, past family history, past medical history, past social history, past surgical history and problem list. Problem list updated.   Objective  Blood pressure 120/80, weight 150 lb (68 kg), last menstrual period 11/20/2019. Pregravid weight 119 lb (54 kg) Total Weight Gain 31 lb (14.1 kg) Urinalysis:      Fetal Status: Fetal Heart Rate (bpm): 145 Fundal Height: 24 cm Movement: Present     General:  Alert, oriented and cooperative. Patient is in no acute distress.  Skin: Skin is warm and dry. No rash noted.   Cardiovascular: Normal heart rate noted  Respiratory: Normal respiratory effort, no problems with respiration noted  Abdomen: Soft, gravid, appropriate for gestational age. Pain/Pressure: Absent     Pelvic:  Cervical exam deferred        Extremities: Normal range of motion.     ental Status: Normal mood and affect. Normal behavior. Normal judgment and thought content.     Assessment   19 y.o. G1P0 at [redacted]w[redacted]d by  09/01/2020, by Ultrasound presenting for routine prenatal visit  Plan   pregnancy 1 Problems  (from 11/20/19 to present)    Problem Noted Resolved   Nausea and vomiting during pregnancy 01/11/2020 by Conard Novak, MD No   Supervision of other normal pregnancy, antepartum 01/09/2020 by Mirna Mires, CNM No   Overview Addendum 05/14/2020  3:23 PM by Vena Austria, MD     Clinic [facility] Prenatal Labs  Dating EDD by 7w u/s Blood type:   O+  Genetic Screen Declined Antibody: neg  Anatomic [place] Normal 04/16/20 Rubella:  immune  GTT Early:  NA              Third trimester:  RPR:   non reactive  Flu vaccine  HBsAg:   neg  TDaP vaccine                                               Rhogam: HIV: Non Reactive (02/21 0454)   Baby Food                                               GBS: (For PCN allergy, check sensitivities)  Contraception  Pap:  ASCUS, neg HPV  Circumcision    Pediatrician    Support Person             Previous Version      Gestational age appropriate obstetric precautions including but not limited to vaginal bleeding, contractions, leaking of fluid and fetal movement were reviewed in detail with the patient.    Return in about 4 weeks (  around 06/11/2020) for ROB and 28 week labs.  Vena Austria, MD, [facility], [facility] 05/14/2020, 3:35 PM

## 2020-06-11 ENCOUNTER — Other Ambulatory Visit: Payer: BC Managed Care – PPO

## 2020-06-11 ENCOUNTER — Other Ambulatory Visit: Payer: Self-pay

## 2020-06-11 ENCOUNTER — Encounter: Payer: Self-pay | Admitting: Obstetrics and Gynecology

## 2020-06-11 ENCOUNTER — Ambulatory Visit (INDEPENDENT_AMBULATORY_CARE_PROVIDER_SITE_OTHER): Payer: BC Managed Care – PPO | Admitting: Obstetrics and Gynecology

## 2020-06-11 VITALS — BP 122/78 | Wt 155.0 lb

## 2020-06-11 DIAGNOSIS — Z3480 Encounter for supervision of other normal pregnancy, unspecified trimester: Secondary | ICD-10-CM

## 2020-06-11 DIAGNOSIS — O219 Vomiting of pregnancy, unspecified: Secondary | ICD-10-CM

## 2020-06-11 DIAGNOSIS — Z3A28 28 weeks gestation of pregnancy: Secondary | ICD-10-CM

## 2020-06-11 NOTE — Progress Notes (Signed)
Routine Prenatal Care Visit  Subjective  Deborah Mcfarland is a 19 y.o. G1P0 at [redacted]w[redacted]d being seen today for ongoing prenatal care.  She is currently monitored for the following issues for this low-risk pregnancy and has Nausea and vomiting; Tachycardia; Hypokalemia; Hyponatremia; Acute pyelonephritis; Sepsis secondary to UTI [facility]); Supervision of other normal pregnancy, antepartum; History of uterine anomaly; and Nausea and vomiting during pregnancy on their problem list.  ----------------------------------------------------------------------------------- Patient reports no complaints.   Contractions: Not present. Vag. Bleeding: None.  Movement: Present. Leaking Fluid denies.  ----------------------------------------------------------------------------------- The following portions of the patient's history were reviewed and updated as appropriate: allergies, current medications, past family history, past medical history, past social history, past surgical history and problem list. Problem list updated.  Objective  Blood pressure 122/78, weight 155 lb (70.3 kg), last menstrual period 11/20/2019. Pregravid weight 119 lb (54 kg) Total Weight Gain 36 lb (16.3 kg) Urinalysis: Urine Protein    Urine Glucose    Fetal Status: Fetal Heart Rate (bpm): 135 Fundal Height: 28 cm Movement: Present     General:  Alert, oriented and cooperative. Patient is in no acute distress.  Skin: Skin is warm and dry. No rash noted.   Cardiovascular: Normal heart rate noted  Respiratory: Normal respiratory effort, no problems with respiration noted  Abdomen: Soft, gravid, appropriate for gestational age. Pain/Pressure: Absent     Pelvic:  Cervical exam deferred        Extremities: Normal range of motion.  Edema: None  Mental Status: Normal mood and affect. Normal behavior. Normal judgment and thought content.   Assessment   19 y.o. G1P0 at [redacted]w[redacted]d by  09/01/2020, by Ultrasound presenting for routine prenatal  visit  Plan   pregnancy 1 Problems (from 11/20/19 to present)    Problem Noted Resolved   Nausea and vomiting during pregnancy 01/11/2020 by Conard Novak, MD No   Supervision of other normal pregnancy, antepartum 01/09/2020 by Mirna Mires, CNM No   Overview Addendum 05/14/2020  3:23 PM by Vena Austria, MD     Clinic [facility] Prenatal Labs  Dating EDD by 7w u/s Blood type:   O+  Genetic Screen Declined Antibody: neg  Anatomic [place] Normal 04/16/20 Rubella:  immune  GTT Early:  NA              Third trimester:  RPR:   non reactive  Flu vaccine  HBsAg:   neg  TDaP vaccine                                               Rhogam: HIV: Non Reactive (02/21 0454)   Baby Food                                               GBS: (For PCN allergy, check sensitivities)  Contraception  Pap:  ASCUS, neg HPV  Circumcision    Pediatrician    Support Person             Previous Version       Preterm labor symptoms and general obstetric precautions including but not limited to vaginal bleeding, contractions, leaking of fluid and fetal movement were reviewed in detail with the patient. Please refer to After Visit Summary  for other counseling recommendations.   Return in about 2 weeks (around 06/25/2020) for Routine Prenatal Appointment.   Thomasene Mohair, MD, [facility], [facility] 06/11/2020 11:16 AM

## 2020-06-12 LAB — 28 WEEK RH+PANEL
Basophils Absolute: 0 10*3/uL (ref 0.0–0.2)
Basos: 0 %
EOS (ABSOLUTE): 0.1 10*3/uL (ref 0.0–0.4)
Eos: 1 %
Gestational Diabetes Screen: 161 mg/dL — ABNORMAL HIGH (ref 65–139)
HIV Screen 4th Generation wRfx: NONREACTIVE
Hematocrit: 32.3 % — ABNORMAL LOW (ref 34.0–46.6)
Hemoglobin: 11.1 g/dL (ref 11.1–15.9)
Immature Grans (Abs): 0.1 10*3/uL (ref 0.0–0.1)
Immature Granulocytes: 1 %
Lymphocytes Absolute: 1.5 10*3/uL (ref 0.7–3.1)
Lymphs: 14 %
MCH: 30.2 pg (ref 26.6–33.0)
MCHC: 34.4 g/dL (ref 31.5–35.7)
MCV: 88 fL (ref 79–97)
Monocytes Absolute: 0.6 10*3/uL (ref 0.1–0.9)
Monocytes: 6 %
Neutrophils Absolute: 8.2 10*3/uL — ABNORMAL HIGH (ref 1.4–7.0)
Neutrophils: 78 %
Platelets: 156 10*3/uL (ref 150–450)
RBC: 3.68 x10E6/uL — ABNORMAL LOW (ref 3.77–5.28)
RDW: 12.2 % (ref 11.7–15.4)
RPR Ser Ql: NONREACTIVE
WBC: 10.5 10*3/uL (ref 3.4–10.8)

## 2020-06-15 ENCOUNTER — Other Ambulatory Visit: Payer: Self-pay | Admitting: Obstetrics and Gynecology

## 2020-06-15 DIAGNOSIS — O99810 Abnormal glucose complicating pregnancy: Secondary | ICD-10-CM

## 2020-06-15 DIAGNOSIS — Z3A29 29 weeks gestation of pregnancy: Secondary | ICD-10-CM

## 2020-06-15 DIAGNOSIS — Z3480 Encounter for supervision of other normal pregnancy, unspecified trimester: Secondary | ICD-10-CM

## 2020-06-25 ENCOUNTER — Encounter: Payer: BC Managed Care – PPO | Admitting: Obstetrics

## 2020-06-26 ENCOUNTER — Encounter: Payer: BC Managed Care – PPO | Admitting: Obstetrics and Gynecology

## 2020-06-26 ENCOUNTER — Other Ambulatory Visit: Payer: BC Managed Care – PPO

## 2020-07-02 ENCOUNTER — Other Ambulatory Visit: Payer: BC Managed Care – PPO

## 2020-07-02 ENCOUNTER — Encounter: Payer: Self-pay | Admitting: Advanced Practice Midwife

## 2020-07-02 ENCOUNTER — Ambulatory Visit (INDEPENDENT_AMBULATORY_CARE_PROVIDER_SITE_OTHER): Payer: BC Managed Care – PPO | Admitting: Advanced Practice Midwife

## 2020-07-02 ENCOUNTER — Other Ambulatory Visit: Payer: Self-pay

## 2020-07-02 VITALS — BP 100/68 | Wt 162.0 lb

## 2020-07-02 DIAGNOSIS — Z3A29 29 weeks gestation of pregnancy: Secondary | ICD-10-CM

## 2020-07-02 DIAGNOSIS — Z3480 Encounter for supervision of other normal pregnancy, unspecified trimester: Secondary | ICD-10-CM

## 2020-07-02 DIAGNOSIS — Z3483 Encounter for supervision of other normal pregnancy, third trimester: Secondary | ICD-10-CM

## 2020-07-02 DIAGNOSIS — Z3A31 31 weeks gestation of pregnancy: Secondary | ICD-10-CM

## 2020-07-02 DIAGNOSIS — O99810 Abnormal glucose complicating pregnancy: Secondary | ICD-10-CM

## 2020-07-02 LAB — POCT URINALYSIS DIPSTICK OB: Glucose, UA: NEGATIVE

## 2020-07-02 NOTE — Progress Notes (Signed)
ROB - 3hr, no concerns. RM 4

## 2020-07-02 NOTE — Progress Notes (Signed)
Routine Prenatal Care Visit  Subjective  Deborah Mcfarland is a 19 y.o. G1P0 at [redacted]w[redacted]d being seen today for ongoing prenatal care.  She is currently monitored for the following issues for this low-risk pregnancy and has Nausea and vomiting; Tachycardia; Hypokalemia; Hyponatremia; Acute pyelonephritis; Sepsis secondary to UTI [facility]); Supervision of other normal pregnancy, antepartum; History of uterine anomaly; and Nausea and vomiting during pregnancy on their problem list.  ----------------------------------------------------------------------------------- Patient reports no complaints.  We discussed increasing healthy diet and physical activity to limit weight gain. Contractions: Not present. Vag. Bleeding: None.  Movement: Present. Leaking Fluid denies.  ----------------------------------------------------------------------------------- The following portions of the patient's history were reviewed and updated as appropriate: allergies, current medications, past family history, past medical history, past social history, past surgical history and problem list. Problem list updated.  Objective  Blood pressure 100/68, weight 162 lb (73.5 kg), last menstrual period 11/20/2019. Pregravid weight 119 lb (54 kg) Total Weight Gain 43 lb (19.5 kg) Urinalysis: Urine Protein Trace  Urine Glucose Negative  Fetal Status: Fetal Heart Rate (bpm): 124 Fundal Height: 31 cm Movement: Present     General:  Alert, oriented and cooperative. Patient is in no acute distress.  Skin: Skin is warm and dry. No rash noted.   Cardiovascular: Normal heart rate noted  Respiratory: Normal respiratory effort, no problems with respiration noted  Abdomen: Soft, gravid, appropriate for gestational age. Pain/Pressure: Absent     Pelvic:  Cervical exam deferred        Extremities: Normal range of motion.  Edema: None  Mental Status: Normal mood and affect. Normal behavior. Normal judgment and thought content.   Assessment   19  y.o. G1P0 at [redacted]w[redacted]d by  09/01/2020, by Ultrasound presenting for routine prenatal visit  Plan   pregnancy 1 Problems (from 11/20/19 to present)    Problem Noted Resolved   Nausea and vomiting during pregnancy 01/11/2020 by Conard Novak, MD No   Supervision of other normal pregnancy, antepartum 01/09/2020 by Mirna Mires, CNM No   Overview Addendum 06/15/2020  3:49 PM by Vena Austria, MD     Clinic [facility] Prenatal Labs  Dating EDD by 7w u/s Blood type:   O+  Genetic Screen Declined Antibody: neg  Anatomic [place] Normal 04/16/20 Rubella:  immune  GTT Early:  NA              Third trimester: 161 3-hr: [ ]   RPR:   non reactive  Flu vaccine  HBsAg:   neg  TDaP vaccine                                               Rhogam: HIV: Non Reactive (02/21 0454)   Baby Food                                               GBS: (For PCN allergy, check sensitivities)  Contraception  Pap:  ASCUS, neg HPV  Circumcision    Pediatrician    Support Person             Previous Version       Preterm labor symptoms and general obstetric precautions including but not limited to vaginal bleeding, contractions, leaking of fluid and fetal movement  were reviewed in detail with the patient. Please refer to After Visit Summary for other counseling recommendations.   Return in about 2 weeks (around 07/16/2020) for rob.  Tresea Mall, CNM 07/02/2020 10:59 AM

## 2020-07-02 NOTE — Patient Instructions (Signed)
Third Trimester of Pregnancy The third trimester is from week 28 through week 40 (months 7 through 9). The third trimester is a time when the unborn baby (fetus) is growing rapidly. At the end of the ninth month, the fetus is about 20 inches in length and weighs 6-10 pounds. Body changes during your third trimester Your body will continue to go through many changes during pregnancy. The changes vary from woman to woman. During the third trimester:  Your weight will continue to increase. You can expect to gain 25-35 pounds (11-16 kg) by the end of the pregnancy.  You may begin to get stretch marks on your hips, abdomen, and breasts.  You may urinate more often because the fetus is moving lower into your pelvis and pressing on your bladder.  You may develop or continue to have heartburn. This is caused by increased hormones that slow down muscles in the digestive tract.  You may develop or continue to have constipation because increased hormones slow digestion and cause the muscles that push waste through your intestines to relax.  You may develop hemorrhoids. These are swollen veins (varicose veins) in the rectum that can itch or be painful.  You may develop swollen, bulging veins (varicose veins) in your legs.  You may have increased body aches in the pelvis, back, or thighs. This is due to weight gain and increased hormones that are relaxing your joints.  You may have changes in your hair. These can include thickening of your hair, rapid growth, and changes in texture. Some women also have hair loss during or after pregnancy, or hair that feels dry or thin. Your hair will most likely return to normal after your baby is born.  Your breasts will continue to grow and they will continue to become tender. A yellow fluid (colostrum) may leak from your breasts. This is the first milk you are producing for your baby.  Your belly button may stick out.  You may notice more swelling in your hands,  face, or ankles.  You may have increased tingling or numbness in your hands, arms, and legs. The skin on your belly may also feel numb.  You may feel short of breath because of your expanding uterus.  You may have more problems sleeping. This can be caused by the size of your belly, increased need to urinate, and an increase in your body's metabolism.  You may notice the fetus "dropping," or moving lower in your abdomen (lightening).  You may have increased vaginal discharge.  You may notice your joints feel loose and you may have pain around your pelvic bone. What to expect at prenatal visits You will have prenatal exams every 2 weeks until week 36. Then you will have weekly prenatal exams. During a routine prenatal visit:  You will be weighed to make sure you and the baby are growing normally.  Your blood pressure will be taken.  Your abdomen will be measured to track your baby's growth.  The fetal heartbeat will be listened to.  Any test results from the previous visit will be discussed.  You may have a cervical check near your due date to see if your cervix has softened or thinned (effaced).  You will be tested for Group B streptococcus. This happens between 35 and 37 weeks. Your health care provider may ask you:  What your birth plan is.  How you are feeling.  If you are feeling the baby move.  If you have had any abnormal   symptoms, such as leaking fluid, bleeding, severe headaches, or abdominal cramping.  If you are using any tobacco products, including cigarettes, chewing tobacco, and electronic cigarettes.  If you have any questions. Other tests or screenings that may be performed during your third trimester include:  Blood tests that check for low iron levels (anemia).  Fetal testing to check the health, activity level, and growth of the fetus. Testing is done if you have certain medical conditions or if there are problems during the pregnancy.  Nonstress test  (NST). This test checks the health of your baby to make sure there are no signs of problems, such as the baby not getting enough oxygen. During this test, a belt is placed around your belly. The baby is made to move, and its heart rate is monitored during movement. What is false labor? False labor is a condition in which you feel small, irregular tightenings of the muscles in the womb (contractions) that usually go away with rest, changing position, or drinking water. These are called Braxton Hicks contractions. Contractions may last for hours, days, or even weeks before true labor sets in. If contractions come at regular intervals, become more frequent, increase in intensity, or become painful, you should see your health care provider. What are the signs of labor?  Abdominal cramps.  Regular contractions that start at 10 minutes apart and become stronger and more frequent with time.  Contractions that start on the top of the uterus and spread down to the lower abdomen and back.  Increased pelvic pressure and dull back pain.  A watery or bloody mucus discharge that comes from the vagina.  Leaking of amniotic fluid. This is also known as your "water breaking." It could be a slow trickle or a gush. Let your health care provider know if it has a color or strange odor. If you have any of these signs, call your health care provider right away, even if it is before your due date. Follow these instructions at home: Medicines  Follow your health care provider's instructions regarding medicine use. Specific medicines may be either safe or unsafe to take during pregnancy.  Take a prenatal vitamin that contains at least 600 micrograms (mcg) of folic acid.  If you develop constipation, try taking a stool softener if your health care provider approves. Eating and drinking   Eat a balanced diet that includes fresh fruits and vegetables, whole grains, good sources of protein such as meat, eggs, or tofu,  and low-fat dairy. Your health care provider will help you determine the amount of weight gain that is right for you.  Avoid raw meat and uncooked cheese. These carry germs that can cause birth defects in the baby.  If you have low calcium intake from food, talk to your health care provider about whether you should take a daily calcium supplement.  Eat four or five small meals rather than three large meals a day.  Limit foods that are high in fat and processed sugars, such as fried and sweet foods.  To prevent constipation: ? Drink enough fluid to keep your urine clear or pale yellow. ? Eat foods that are high in fiber, such as fresh fruits and vegetables, whole grains, and beans. Activity  Exercise only as directed by your health care provider. Most women can continue their usual exercise routine during pregnancy. Try to exercise for 30 minutes at least 5 days a week. Stop exercising if you experience uterine contractions.  Avoid heavy lifting.  Do   not exercise in extreme heat or humidity, or at high altitudes.  Wear low-heel, comfortable shoes.  Practice good posture.  You may continue to have sex unless your health care provider tells you otherwise. Relieving pain and discomfort  Take frequent breaks and rest with your legs elevated if you have leg cramps or low back pain.  Take warm sitz baths to soothe any pain or discomfort caused by hemorrhoids. Use hemorrhoid cream if your health care provider approves.  Wear a good support bra to prevent discomfort from breast tenderness.  If you develop varicose veins: ? Wear support pantyhose or compression stockings as told by your healthcare provider. ? Elevate your feet for 15 minutes, 3-4 times a day. Prenatal care  Write down your questions. Take them to your prenatal visits.  Keep all your prenatal visits as told by your health care provider. This is important. Safety  Wear your seat belt at all times when driving.  Make  a list of emergency phone numbers, including numbers for family, friends, the hospital, and police and fire departments. General instructions  Avoid cat litter boxes and soil used by cats. These carry germs that can cause birth defects in the baby. If you have a cat, ask someone to clean the litter box for you.  Do not travel far distances unless it is absolutely necessary and only with the approval of your health care provider.  Do not use hot tubs, steam rooms, or saunas.  Do not drink alcohol.  Do not use any products that contain nicotine or tobacco, such as cigarettes and e-cigarettes. If you need help quitting, ask your health care provider.  Do not use any medicinal herbs or unprescribed drugs. These chemicals affect the formation and growth of the baby.  Do not douche or use tampons or scented sanitary pads.  Do not cross your legs for long periods of time.  To prepare for the arrival of your baby: ? Take prenatal classes to understand, practice, and ask questions about labor and delivery. ? Make a trial run to the hospital. ? Visit the hospital and tour the maternity area. ? Arrange for maternity or paternity leave through employers. ? Arrange for family and friends to take care of pets while you are in the hospital. ? Purchase a rear-facing car seat and make sure you know how to install it in your car. ? Pack your hospital bag. ? Prepare the baby's nursery. Make sure to remove all pillows and stuffed animals from the baby's crib to prevent suffocation.  Visit your dentist if you have not gone during your pregnancy. Use a soft toothbrush to brush your teeth and be gentle when you floss. Contact a health care provider if:  You are unsure if you are in labor or if your water has broken.  You become dizzy.  You have mild pelvic cramps, pelvic pressure, or nagging pain in your abdominal area.  You have lower back pain.  You have persistent nausea, vomiting, or  diarrhea.  You have an unusual or bad smelling vaginal discharge.  You have pain when you urinate. Get help right away if:  Your water breaks before 37 weeks.  You have regular contractions less than 5 minutes apart before 37 weeks.  You have a fever.  You are leaking fluid from your vagina.  You have spotting or bleeding from your vagina.  You have severe abdominal pain or cramping.  You have rapid weight loss or weight gain.  You have   shortness of breath with chest pain.  You notice sudden or extreme swelling of your face, hands, ankles, feet, or legs.  Your baby makes fewer than 10 movements in 2 hours.  You have severe headaches that do not go away when you take medicine.  You have vision changes. Summary  The third trimester is from week 28 through week 40, months 7 through 9. The third trimester is a time when the unborn baby (fetus) is growing rapidly.  During the third trimester, your discomfort may increase as you and your baby continue to gain weight. You may have abdominal, leg, and back pain, sleeping problems, and an increased need to urinate.  During the third trimester your breasts will keep growing and they will continue to become tender. A yellow fluid (colostrum) may leak from your breasts. This is the first milk you are producing for your baby.  False labor is a condition in which you feel small, irregular tightenings of the muscles in the womb (contractions) that eventually go away. These are called Braxton Hicks contractions. Contractions may last for hours, days, or even weeks before true labor sets in.  Signs of labor can include: abdominal cramps; regular contractions that start at 10 minutes apart and become stronger and more frequent with time; watery or bloody mucus discharge that comes from the vagina; increased pelvic pressure and dull back pain; and leaking of amniotic fluid. This information is not intended to replace advice given to you by your  health care provider. Make sure you discuss any questions you have with your health care provider. Document Revised: 10/27/2018 Document Reviewed: 08/11/2016 Elsevier Patient Education  2020 [employer]  

## 2020-07-03 LAB — GESTATIONAL GLUCOSE TOLERANCE
Glucose, Fasting: 90 mg/dL (ref 65–94)
Glucose, GTT - 1 Hour: 168 mg/dL (ref 65–179)
Glucose, GTT - 2 Hour: 167 mg/dL — ABNORMAL HIGH (ref 65–154)
Glucose, GTT - 3 Hour: 131 mg/dL (ref 65–139)

## 2020-07-16 ENCOUNTER — Ambulatory Visit (INDEPENDENT_AMBULATORY_CARE_PROVIDER_SITE_OTHER): Payer: BC Managed Care – PPO | Admitting: Obstetrics and Gynecology

## 2020-07-16 ENCOUNTER — Other Ambulatory Visit: Payer: Self-pay

## 2020-07-16 ENCOUNTER — Encounter: Payer: Self-pay | Admitting: Obstetrics and Gynecology

## 2020-07-16 VITALS — BP 120/70 | Ht 62.0 in | Wt 166.0 lb

## 2020-07-16 DIAGNOSIS — O26843 Uterine size-date discrepancy, third trimester: Secondary | ICD-10-CM

## 2020-07-16 DIAGNOSIS — O219 Vomiting of pregnancy, unspecified: Secondary | ICD-10-CM

## 2020-07-16 DIAGNOSIS — Z3403 Encounter for supervision of normal first pregnancy, third trimester: Secondary | ICD-10-CM

## 2020-07-16 DIAGNOSIS — Z3A33 33 weeks gestation of pregnancy: Secondary | ICD-10-CM

## 2020-07-16 NOTE — Patient Instructions (Signed)
Third Trimester of Pregnancy The third trimester is from week 28 through week 40 (months 7 through 9). The third trimester is a time when the unborn baby (fetus) is growing rapidly. At the end of the ninth month, the fetus is about 20 inches in length and weighs 6-10 pounds. Body changes during your third trimester Your body will continue to go through many changes during pregnancy. The changes vary from woman to woman. During the third trimester:  Your weight will continue to increase. You can expect to gain 25-35 pounds (11-16 kg) by the end of the pregnancy.  You may begin to get stretch marks on your hips, abdomen, and breasts.  You may urinate more often because the fetus is moving lower into your pelvis and pressing on your bladder.  You may develop or continue to have heartburn. This is caused by increased hormones that slow down muscles in the digestive tract.  You may develop or continue to have constipation because increased hormones slow digestion and cause the muscles that push waste through your intestines to relax.  You may develop hemorrhoids. These are swollen veins (varicose veins) in the rectum that can itch or be painful.  You may develop swollen, bulging veins (varicose veins) in your legs.  You may have increased body aches in the pelvis, back, or thighs. This is due to weight gain and increased hormones that are relaxing your joints.  You may have changes in your hair. These can include thickening of your hair, rapid growth, and changes in texture. Some women also have hair loss during or after pregnancy, or hair that feels dry or thin. Your hair will most likely return to normal after your baby is born.  Your breasts will continue to grow and they will continue to become tender. A yellow fluid (colostrum) may leak from your breasts. This is the first milk you are producing for your baby.  Your belly button may stick out.  You may notice more swelling in your hands,  face, or ankles.  You may have increased tingling or numbness in your hands, arms, and legs. The skin on your belly may also feel numb.  You may feel short of breath because of your expanding uterus.  You may have more problems sleeping. This can be caused by the size of your belly, increased need to urinate, and an increase in your body's metabolism.  You may notice the fetus "dropping," or moving lower in your abdomen (lightening).  You may have increased vaginal discharge.  You may notice your joints feel loose and you may have pain around your pelvic bone. What to expect at prenatal visits You will have prenatal exams every 2 weeks until week 36. Then you will have weekly prenatal exams. During a routine prenatal visit:  You will be weighed to make sure you and the baby are growing normally.  Your blood pressure will be taken.  Your abdomen will be measured to track your baby's growth.  The fetal heartbeat will be listened to.  Any test results from the previous visit will be discussed.  You may have a cervical check near your due date to see if your cervix has softened or thinned (effaced).  You will be tested for Group B streptococcus. This happens between 35 and 37 weeks. Your health care provider may ask you:  What your birth plan is.  How you are feeling.  If you are feeling the baby move.  If you have had any abnormal   symptoms, such as leaking fluid, bleeding, severe headaches, or abdominal cramping.  If you are using any tobacco products, including cigarettes, chewing tobacco, and electronic cigarettes.  If you have any questions. Other tests or screenings that may be performed during your third trimester include:  Blood tests that check for low iron levels (anemia).  Fetal testing to check the health, activity level, and growth of the fetus. Testing is done if you have certain medical conditions or if there are problems during the pregnancy.  Nonstress test  (NST). This test checks the health of your baby to make sure there are no signs of problems, such as the baby not getting enough oxygen. During this test, a belt is placed around your belly. The baby is made to move, and its heart rate is monitored during movement. What is false labor? False labor is a condition in which you feel small, irregular tightenings of the muscles in the womb (contractions) that usually go away with rest, changing position, or drinking water. These are called Braxton Hicks contractions. Contractions may last for hours, days, or even weeks before true labor sets in. If contractions come at regular intervals, become more frequent, increase in intensity, or become painful, you should see your health care provider. What are the signs of labor?  Abdominal cramps.  Regular contractions that start at 10 minutes apart and become stronger and more frequent with time.  Contractions that start on the top of the uterus and spread down to the lower abdomen and back.  Increased pelvic pressure and dull back pain.  A watery or bloody mucus discharge that comes from the vagina.  Leaking of amniotic fluid. This is also known as your "water breaking." It could be a slow trickle or a gush. Let your health care provider know if it has a color or strange odor. If you have any of these signs, call your health care provider right away, even if it is before your due date. Follow these instructions at home: Medicines  Follow your health care provider's instructions regarding medicine use. Specific medicines may be either safe or unsafe to take during pregnancy.  Take a prenatal vitamin that contains at least 600 micrograms (mcg) of folic acid.  If you develop constipation, try taking a stool softener if your health care provider approves. Eating and drinking   Eat a balanced diet that includes fresh fruits and vegetables, whole grains, good sources of protein such as meat, eggs, or tofu,  and low-fat dairy. Your health care provider will help you determine the amount of weight gain that is right for you.  Avoid raw meat and uncooked cheese. These carry germs that can cause birth defects in the baby.  If you have low calcium intake from food, talk to your health care provider about whether you should take a daily calcium supplement.  Eat four or five small meals rather than three large meals a day.  Limit foods that are high in fat and processed sugars, such as fried and sweet foods.  To prevent constipation: ? Drink enough fluid to keep your urine clear or pale yellow. ? Eat foods that are high in fiber, such as fresh fruits and vegetables, whole grains, and beans. Activity  Exercise only as directed by your health care provider. Most women can continue their usual exercise routine during pregnancy. Try to exercise for 30 minutes at least 5 days a week. Stop exercising if you experience uterine contractions.  Avoid heavy lifting.  Do   not exercise in extreme heat or humidity, or at high altitudes.  Wear low-heel, comfortable shoes.  Practice good posture.  You may continue to have sex unless your health care provider tells you otherwise. Relieving pain and discomfort  Take frequent breaks and rest with your legs elevated if you have leg cramps or low back pain.  Take warm sitz baths to soothe any pain or discomfort caused by hemorrhoids. Use hemorrhoid cream if your health care provider approves.  Wear a good support bra to prevent discomfort from breast tenderness.  If you develop varicose veins: ? Wear support pantyhose or compression stockings as told by your healthcare provider. ? Elevate your feet for 15 minutes, 3-4 times a day. Prenatal care  Write down your questions. Take them to your prenatal visits.  Keep all your prenatal visits as told by your health care provider. This is important. Safety  Wear your seat belt at all times when driving.  Make  a list of emergency phone numbers, including numbers for family, friends, the hospital, and police and fire departments. General instructions  Avoid cat litter boxes and soil used by cats. These carry germs that can cause birth defects in the baby. If you have a cat, ask someone to clean the litter box for you.  Do not travel far distances unless it is absolutely necessary and only with the approval of your health care provider.  Do not use hot tubs, steam rooms, or saunas.  Do not drink alcohol.  Do not use any products that contain nicotine or tobacco, such as cigarettes and e-cigarettes. If you need help quitting, ask your health care provider.  Do not use any medicinal herbs or unprescribed drugs. These chemicals affect the formation and growth of the baby.  Do not douche or use tampons or scented sanitary pads.  Do not cross your legs for long periods of time.  To prepare for the arrival of your baby: ? Take prenatal classes to understand, practice, and ask questions about labor and delivery. ? Make a trial run to the hospital. ? Visit the hospital and tour the maternity area. ? Arrange for maternity or paternity leave through employers. ? Arrange for family and friends to take care of pets while you are in the hospital. ? Purchase a rear-facing car seat and make sure you know how to install it in your car. ? Pack your hospital bag. ? Prepare the baby's nursery. Make sure to remove all pillows and stuffed animals from the baby's crib to prevent suffocation.  Visit your dentist if you have not gone during your pregnancy. Use a soft toothbrush to brush your teeth and be gentle when you floss. Contact a health care provider if:  You are unsure if you are in labor or if your water has broken.  You become dizzy.  You have mild pelvic cramps, pelvic pressure, or nagging pain in your abdominal area.  You have lower back pain.  You have persistent nausea, vomiting, or  diarrhea.  You have an unusual or bad smelling vaginal discharge.  You have pain when you urinate. Get help right away if:  Your water breaks before 37 weeks.  You have regular contractions less than 5 minutes apart before 37 weeks.  You have a fever.  You are leaking fluid from your vagina.  You have spotting or bleeding from your vagina.  You have severe abdominal pain or cramping.  You have rapid weight loss or weight gain.  You have   shortness of breath with chest pain.  You notice sudden or extreme swelling of your face, hands, ankles, feet, or legs.  Your baby makes fewer than 10 movements in 2 hours.  You have severe headaches that do not go away when you take medicine.  You have vision changes. Summary  The third trimester is from week 28 through week 40, months 7 through 9. The third trimester is a time when the unborn baby (fetus) is growing rapidly.  During the third trimester, your discomfort may increase as you and your baby continue to gain weight. You may have abdominal, leg, and back pain, sleeping problems, and an increased need to urinate.  During the third trimester your breasts will keep growing and they will continue to become tender. A yellow fluid (colostrum) may leak from your breasts. This is the first milk you are producing for your baby.  False labor is a condition in which you feel small, irregular tightenings of the muscles in the womb (contractions) that eventually go away. These are called Braxton Hicks contractions. Contractions may last for hours, days, or even weeks before true labor sets in.  Signs of labor can include: abdominal cramps; regular contractions that start at 10 minutes apart and become stronger and more frequent with time; watery or bloody mucus discharge that comes from the vagina; increased pelvic pressure and dull back pain; and leaking of amniotic fluid. This information is not intended to replace advice given to you by your  health care provider. Make sure you discuss any questions you have with your health care provider. Document Revised: 10/27/2018 Document Reviewed: 08/11/2016 Elsevier Patient Education  2020 [employer]  

## 2020-07-16 NOTE — Progress Notes (Signed)
Routine Prenatal Care Visit  Subjective  Deborah Mcfarland is a 19 y.o. G1P0 at [redacted]w[redacted]d being seen today for ongoing prenatal care.  She is currently monitored for the following issues for this low-risk pregnancy and has Nausea and vomiting; Tachycardia; Hypokalemia; Hyponatremia; Acute pyelonephritis; Sepsis secondary to UTI [facility]); Supervision of other normal pregnancy, antepartum; History of uterine anomaly; and Nausea and vomiting during pregnancy on their problem list.  ----------------------------------------------------------------------------------- Patient reports no complaints.   Contractions: Not present. Vag. Bleeding: None.  Movement: Present. Denies leaking of fluid.  ----------------------------------------------------------------------------------- The following portions of the patient's history were reviewed and updated as appropriate: allergies, current medications, past family history, past medical history, past social history, past surgical history and problem list. Problem list updated.   Objective  Blood pressure 120/70, height 5\' 2"  (1.575 m), weight 166 lb (75.3 kg), last menstrual period 11/20/2019. Pregravid weight 119 lb (54 kg) Total Weight Gain 47 lb (21.3 kg) Urinalysis:      Fetal Status: Fetal Heart Rate (bpm): 147 Fundal Height: 36 cm Movement: Present     General:  Alert, oriented and cooperative. Patient is in no acute distress.  Skin: Skin is warm and dry. No rash noted.   Cardiovascular: Normal heart rate noted  Respiratory: Normal respiratory effort, no problems with respiration noted  Abdomen: Soft, gravid, appropriate for gestational age. Pain/Pressure: Absent     Pelvic:  Cervical exam deferred        Extremities: Normal range of motion.  Edema: None  Mental Status: Normal mood and affect. Normal behavior. Normal judgment and thought content.     Assessment   19 y.o. G1P0 at [redacted]w[redacted]d by  09/01/2020, by Ultrasound presenting for routine prenatal  visit  Plan   pregnancy 1 Problems (from 11/20/19 to present)    Problem Noted Resolved   Nausea and vomiting during pregnancy 01/11/2020 by 01/13/2020, MD No   Supervision of other normal pregnancy, antepartum 01/09/2020 by 01/11/2020, CNM No   Overview Addendum 07/16/2020  2:35 PM by 07/18/2020, MD     Clinic [facility] Prenatal Labs  Dating EDD by 7w u/s Blood type:   O+  Genetic Screen Declined Antibody: neg  Anatomic [facility] Normal 04/16/20 Rubella:  immune  GTT Early:  NA              Third trimester: 161 3-hr: 90, 168, 167, 132 RPR:   non reactive  Flu vaccine Declines HBsAg:   neg  TDaP vaccine  Declines                                              Rhogam: not needed HIV: Non Reactive (02/21 0454)   Baby Food   Breast                                            GBS: (For PCN allergy, check sensitivities)  Contraception  Undecided Pap:  ASCUS, neg HPV  Circumcision    Pediatrician  Covid: unvaccinated,reports she has had covid twice.   Support Person             Previous Version      Declines all vaccinations Growth 02[birth year]1998 next visit  Gestational age appropriate obstetric  precautions including but not limited to vaginal bleeding, contractions, leaking of fluid and fetal movement were reviewed in detail with the patient.    Return in about 2 weeks (around 07/30/2020) for ROB in person and growth [place].  Natale Milch MD [facility], [facility] 07/16/2020, 2:36 PM

## 2020-07-16 NOTE — Progress Notes (Signed)
[redacted]W[redacted]d ROB

## 2020-07-20 NOTE — L&D Delivery Note (Signed)
Vaginal Delivery Note  Spontaneous delivery of live viable female infant from the LOA position through an intact perineum. Delivery of anterior right shoulder with gentle downward guidance followed by delivery of the left posterior shoulder with gentle upward guidance. Body followed spontaneously. Infant placed on maternal chest. Nursery present and helped with neonatal resuscitation and evaluation. Cord clamped and cut after one minute. Cord blood collected. Placenta delivered spontaneously and intact with a 3 vessel cord.  1st degree perineal and right hymenal laceration. Uterus firm and below umbilicus at the end of the delivery.  Mom and baby recovering in stable condition. Sponge and needle counts were correct at the end of the delivery.  APGARS: 1 minute:8  5 minutes: 9 Weight: pending Epidural present 1st degree perineal and right hymenal laceration  Adelene Idler MD [facility], [facility] 08/11/20 2:44 PM

## 2020-07-30 ENCOUNTER — Other Ambulatory Visit: Payer: Self-pay

## 2020-07-30 ENCOUNTER — Ambulatory Visit (INDEPENDENT_AMBULATORY_CARE_PROVIDER_SITE_OTHER): Payer: Self-pay

## 2020-07-30 ENCOUNTER — Ambulatory Visit (INDEPENDENT_AMBULATORY_CARE_PROVIDER_SITE_OTHER): Payer: BC Managed Care – PPO | Admitting: Obstetrics

## 2020-07-30 VITALS — BP 110/68 | Wt 170.0 lb

## 2020-07-30 DIAGNOSIS — Z3A35 35 weeks gestation of pregnancy: Secondary | ICD-10-CM

## 2020-07-30 DIAGNOSIS — Z3403 Encounter for supervision of normal first pregnancy, third trimester: Secondary | ICD-10-CM

## 2020-07-30 DIAGNOSIS — O26843 Uterine size-date discrepancy, third trimester: Secondary | ICD-10-CM

## 2020-07-30 LAB — POCT URINALYSIS DIPSTICK OB
Glucose, UA: NEGATIVE
POC,PROTEIN,UA: NEGATIVE

## 2020-07-30 NOTE — Addendum Note (Signed)
Addended by: Kathlene Cote on: 07/30/2020 03:30 PM   Modules accepted: Orders

## 2020-07-30 NOTE — Progress Notes (Signed)
Routine Prenatal Care Visit  Subjective  Deborah Mcfarland is a 20 y.o. G1P0 at [redacted]w[redacted]d being seen today for ongoing prenatal care.  She is currently monitored for the following issues for this high-risk pregnancy and has Nausea and vomiting; Tachycardia; Hypokalemia; Hyponatremia; Acute pyelonephritis; Sepsis secondary to UTI [facility]); Supervision of other normal pregnancy, antepartum; History of uterine anomaly; and Nausea and vomiting during pregnancy on their problem list.  ----------------------------------------------------------------------------------- Patient reports backache.  She ahs been resting a lot. Some trouble getting to sleep.Her baby is moving well. Contractions: Not present. Vag. Bleeding: None.  Movement: Present. Leaking Fluid denies.  ----------------------------------------------------------------------------------- The following portions of the patient's history were reviewed and updated as appropriate: allergies, current medications, past family history, past medical history, past social history, past surgical history and problem list. Problem list updated.  Objective  Blood pressure 110/68, weight 170 lb (77.1 kg), last menstrual period 11/20/2019. Pregravid weight 119 lb (54 kg) Total Weight Gain 51 lb (23.1 kg) Urinalysis: Urine Protein    Urine Glucose    Fetal Status: Fetal Heart Rate (bpm): 140s   Movement: Present     General:  Alert, oriented and cooperative. Patient is in no acute distress.  Skin: Skin is warm and dry. No rash noted.   Cardiovascular: Normal heart rate noted  Respiratory: Normal respiratory effort, no problems with respiration noted  Abdomen: Soft, gravid, appropriate for gestational age. Pain/Pressure: Absent     Pelvic:  Cervical exam deferred        Extremities: Normal range of motion.  Edema: None  Mental Status: Normal mood and affect. Normal behavior. Normal judgment and thought content.   Assessment   20 y.o. G1P0 at [redacted]w[redacted]d by   09/01/2020, by Ultrasound presenting for routine prenatal visit  Plan   pregnancy 1 Problems (from 11/20/19 to present)    Problem Noted Resolved   Nausea and vomiting during pregnancy 01/11/2020 by Conard Novak, MD No   Supervision of other normal pregnancy, antepartum 01/09/2020 by Mirna Mires, CNM No   Overview Addendum 07/16/2020  2:35 PM by Natale Milch, MD     Clinic [facility] Prenatal Labs  Dating EDD by 7w u/s Blood type:   O+  Genetic Screen Declined Antibody: neg  Anatomic [place] Normal 04/16/20 Rubella:  immune  GTT Early:  NA              Third trimester: 161 3-hr: 90, 168, 167, 132 RPR:   non reactive  Flu vaccine Declines HBsAg:   neg  TDaP vaccine  Declines                                              Rhogam: not needed HIV: Non Reactive (02/21 0454)   Baby Food   Breast                                            GBS: (For PCN allergy, check sensitivities)  Contraception  Undecided Pap:  ASCUS, neg HPV  Circumcision    Pediatrician  Covid: unvaccinated,reports she has had covid twice.   Support Person             Previous Version       Term labor symptoms and general obstetric  precautions including but not limited to vaginal bleeding, contractions, leaking of fluid and fetal movement were reviewed in detail with the patient. Please refer to After Visit Summary for other counseling recommendations.  Growth scan today shows baby is in the 30% for growth. GBS cx next visit.  Return in about 1 week (around 08/06/2020) for return OB, cultures.  Mirna Mires, CNM  07/30/2020 3:06 PM

## 2020-08-06 ENCOUNTER — Other Ambulatory Visit (HOSPITAL_COMMUNITY)
Admission: RE | Admit: 2020-08-06 | Discharge: 2020-08-06 | Disposition: A | Discharge status: Home / Self Care | Payer: BC Managed Care – PPO | Source: Ambulatory Visit | Attending: Obstetrics and Gynecology | Admitting: Obstetrics and Gynecology

## 2020-08-06 ENCOUNTER — Other Ambulatory Visit: Payer: Self-pay

## 2020-08-06 ENCOUNTER — Encounter: Payer: Self-pay | Admitting: Obstetrics and Gynecology

## 2020-08-06 ENCOUNTER — Ambulatory Visit (INDEPENDENT_AMBULATORY_CARE_PROVIDER_SITE_OTHER): Payer: Self-pay | Admitting: Obstetrics and Gynecology

## 2020-08-06 VITALS — BP 108/62 | Wt 173.0 lb

## 2020-08-06 DIAGNOSIS — Z113 Encounter for screening for infections with a predominantly sexual mode of transmission: Secondary | ICD-10-CM

## 2020-08-06 DIAGNOSIS — Z3A36 36 weeks gestation of pregnancy: Secondary | ICD-10-CM

## 2020-08-06 DIAGNOSIS — Z3403 Encounter for supervision of normal first pregnancy, third trimester: Secondary | ICD-10-CM

## 2020-08-06 DIAGNOSIS — Z3685 Encounter for antenatal screening for Streptococcus B: Secondary | ICD-10-CM

## 2020-08-06 LAB — POCT URINALYSIS DIPSTICK OB
Glucose, UA: NEGATIVE
POC,PROTEIN,UA: NEGATIVE

## 2020-08-06 NOTE — Progress Notes (Signed)
Routine Prenatal Care Visit  Subjective  Deborah Mcfarland is a 20 y.o. G1P0 at [redacted]w[redacted]d being seen today for ongoing prenatal care.  She is currently monitored for the following issues for this low-risk pregnancy and has Supervision of other normal pregnancy, antepartum; History of uterine anomaly; and Nausea and vomiting during pregnancy on their problem list.  ----------------------------------------------------------------------------------- Patient reports discomfort from a tight abdomen. Reports experiencing "lightening crotch" over the last few days..   Contractions: Not present. Vag. Bleeding: None.  Movement: Present. Denies leaking of fluid.  ----------------------------------------------------------------------------------- The following portions of the patient's history were reviewed and updated as appropriate: allergies, current medications, past family history, past medical history, past social history, past surgical history and problem list. Problem list updated.   Objective  Blood pressure 108/62, weight 173 lb (78.5 kg), last menstrual period 11/20/2019. Pregravid weight 119 lb (54 kg) Total Weight Gain 54 lb (24.5 kg) Urinalysis:      Fetal Status: Fetal Heart Rate (bpm): 145 Fundal Height: 37 cm Movement: Present     General:  Alert, oriented and cooperative. Patient is in no acute distress.  Skin: Skin is warm and dry. No rash noted.   Cardiovascular: Normal heart rate noted  Respiratory: Normal respiratory effort, no problems with respiration noted  Abdomen: Soft, gravid, appropriate for gestational age. Pain/Pressure: Present     Pelvic:  Cervical exam performed        Extremities: Normal range of motion.     ental Status: Normal mood and affect. Normal behavior. Normal judgment and thought content.     Assessment   20 y.o. G1P0 at [redacted]w[redacted]d by  09/01/2020, by Ultrasound presenting for routine prenatal visit  Plan   pregnancy 1 Problems (from 11/20/19 to present)     Problem Noted Resolved   Nausea and vomiting during pregnancy 01/11/2020 by Conard Novak, MD No   Supervision of other normal pregnancy, antepartum 01/09/2020 by Mirna Mires, CNM No   Overview Addendum 07/16/2020  2:35 PM by Natale Milch, MD     Clinic [facility] Prenatal Labs  Dating EDD by 7w u/s Blood type:   O+  Genetic Screen Declined Antibody: neg  Anatomic [place] Normal 04/16/20 Rubella:  immune  GTT Early:  NA              Third trimester: 161 3-hr: 90, 168, 167, 132 RPR:   non reactive  Flu vaccine Declines HBsAg:   neg  TDaP vaccine  Declines                                              Rhogam: not needed HIV: Non Reactive (02/21 0454)   Baby Food   Breast                                            GBS: (For PCN allergy, check sensitivities)  Contraception  Undecided Pap:  ASCUS, neg HPV  Circumcision    Pediatrician  Covid: unvaccinated,reports she has had covid twice.   Support Person             Previous Version      -GBS and aptima collected today. Reviewed recommendations for positive GBS in labor. -Discussed pain management plan in labor -  patient desires IV medication and epidural.  Preterm labor precautions including but not limited to vaginal bleeding, contractions, leaking of fluid and fetal movement were reviewed in detail with the patient.    Return in about 1 week (around 08/13/2020) for ROB (can schedule weekly for 2-3 weeks out).  Zipporah Plants, CNM, MSN [facility], [facility] 08/06/2020, 2:59 PM

## 2020-08-06 NOTE — Addendum Note (Signed)
Addended by: Fortunato Curling R on: 08/06/2020 03:09 PM   Modules accepted: Orders

## 2020-08-08 LAB — STREP GP B NAA: Strep Gp B NAA: NEGATIVE

## 2020-08-09 LAB — CERVICOVAGINAL ANCILLARY ONLY
Chlamydia: NEGATIVE
Comment: NEGATIVE
Comment: NORMAL
Neisseria Gonorrhea: NEGATIVE

## 2020-08-11 ENCOUNTER — Inpatient Hospital Stay
Admission: EM | Admit: 2020-08-11 | Discharge: 2020-08-13 | DRG: 807 | Disposition: A | Discharge status: Home / Self Care | Payer: BC Managed Care – PPO | Attending: Obstetrics and Gynecology | Admitting: Obstetrics and Gynecology

## 2020-08-11 ENCOUNTER — Other Ambulatory Visit: Payer: Self-pay

## 2020-08-11 ENCOUNTER — Inpatient Hospital Stay: Payer: BC Managed Care – PPO | Admitting: Anesthesiology

## 2020-08-11 ENCOUNTER — Encounter: Payer: Self-pay | Admitting: Obstetrics and Gynecology

## 2020-08-11 DIAGNOSIS — Z3A37 37 weeks gestation of pregnancy: Secondary | ICD-10-CM

## 2020-08-11 DIAGNOSIS — Z20822 Contact with and (suspected) exposure to covid-19: Secondary | ICD-10-CM | POA: Diagnosis present

## 2020-08-11 DIAGNOSIS — O219 Vomiting of pregnancy, unspecified: Secondary | ICD-10-CM

## 2020-08-11 DIAGNOSIS — O26893 Other specified pregnancy related conditions, third trimester: Secondary | ICD-10-CM | POA: Diagnosis present

## 2020-08-11 DIAGNOSIS — O4202 Full-term premature rupture of membranes, onset of labor within 24 hours of rupture: Secondary | ICD-10-CM

## 2020-08-11 DIAGNOSIS — Z880 Allergy status to penicillin: Secondary | ICD-10-CM

## 2020-08-11 DIAGNOSIS — O4292 Full-term premature rupture of membranes, unspecified as to length of time between rupture and onset of labor: Secondary | ICD-10-CM | POA: Diagnosis present

## 2020-08-11 DIAGNOSIS — F17290 Nicotine dependence, other tobacco product, uncomplicated: Secondary | ICD-10-CM | POA: Diagnosis present

## 2020-08-11 DIAGNOSIS — O4290 Premature rupture of membranes, unspecified as to length of time between rupture and onset of labor, unspecified weeks of gestation: Secondary | ICD-10-CM | POA: Diagnosis present

## 2020-08-11 DIAGNOSIS — O99334 Smoking (tobacco) complicating childbirth: Secondary | ICD-10-CM | POA: Diagnosis present

## 2020-08-11 DIAGNOSIS — Z3480 Encounter for supervision of other normal pregnancy, unspecified trimester: Secondary | ICD-10-CM

## 2020-08-11 LAB — CBC
HCT: 31.6 % — ABNORMAL LOW (ref 36.0–46.0)
Hemoglobin: 10.5 g/dL — ABNORMAL LOW (ref 12.0–15.0)
MCH: 27.5 pg (ref 26.0–34.0)
MCHC: 33.2 g/dL (ref 30.0–36.0)
MCV: 82.7 fL (ref 80.0–100.0)
Platelets: 139 10*3/uL — ABNORMAL LOW (ref 150–400)
RBC: 3.82 MIL/uL — ABNORMAL LOW (ref 3.87–5.11)
RDW: 14.1 % (ref 11.5–15.5)
WBC: 14.9 10*3/uL — ABNORMAL HIGH (ref 4.0–10.5)
nRBC: 0 % (ref 0.0–0.2)

## 2020-08-11 LAB — TYPE AND SCREEN
ABO/RH(D): O POS
Antibody Screen: NEGATIVE

## 2020-08-11 LAB — RAPID HIV SCREEN (HIV 1/2 AB+AG)
HIV 1/2 Antibodies: NONREACTIVE
HIV-1 P24 Antigen - HIV24: NONREACTIVE

## 2020-08-11 LAB — SARS CORONAVIRUS 2 BY RT PCR (HOSPITAL ORDER, PERFORMED IN ~~LOC~~ HOSPITAL LAB): SARS Coronavirus 2: NEGATIVE

## 2020-08-11 MED ORDER — SIMETHICONE 80 MG PO CHEW
80.0000 mg | CHEWABLE_TABLET | ORAL | Status: DC | PRN
  Administered 2020-08-11: 80 mg via ORAL
  Filled 2020-08-11: qty 1

## 2020-08-11 MED ORDER — ACETAMINOPHEN 325 MG PO TABS: 650.0000 mg | ORAL_TABLET | ORAL | Status: DC | PRN

## 2020-08-11 MED ORDER — LIDOCAINE HCL (PF) 1 % IJ SOLN
INTRAMUSCULAR | Status: DC | PRN
  Administered 2020-08-11: 3 mL

## 2020-08-11 MED ORDER — DIPHENHYDRAMINE HCL 25 MG PO CAPS: 25.0000 mg | ORAL_CAPSULE | Freq: Four times a day (QID) | ORAL | Status: DC | PRN

## 2020-08-11 MED ORDER — BENZOCAINE-MENTHOL 20-0.5 % EX AERO
1.0000 "application " | INHALATION_SPRAY | CUTANEOUS | Status: DC | PRN
  Administered 2020-08-11 – 2020-08-13 (×2): 1 via TOPICAL
  Filled 2020-08-11 (×2): qty 56

## 2020-08-11 MED ORDER — PHENYLEPHRINE 40 MCG/ML (10ML) SYRINGE FOR IV PUSH (FOR BLOOD PRESSURE SUPPORT)
PREFILLED_SYRINGE | INTRAVENOUS | Status: AC
  Administered 2020-08-11: 100 ug
  Filled 2020-08-11: qty 10

## 2020-08-11 MED ORDER — DOCUSATE SODIUM 100 MG PO CAPS
100.0000 mg | ORAL_CAPSULE | Freq: Two times a day (BID) | ORAL | Status: DC
  Administered 2020-08-11 – 2020-08-13 (×4): 100 mg via ORAL
  Filled 2020-08-11 (×4): qty 1

## 2020-08-11 MED ORDER — ZOLPIDEM TARTRATE 5 MG PO TABS: 5.0000 mg | ORAL_TABLET | Freq: Every evening | ORAL | Status: DC | PRN

## 2020-08-11 MED ORDER — OXYTOCIN-SODIUM CHLORIDE 30-0.9 UT/500ML-% IV SOLN
2.5000 [IU]/h | INTRAVENOUS | Status: DC
  Administered 2020-08-11: 2.5 [IU]/h via INTRAVENOUS

## 2020-08-11 MED ORDER — COCONUT OIL OIL
1.0000 "application " | TOPICAL_OIL | Status: DC | PRN
  Administered 2020-08-11: 1 via TOPICAL
  Filled 2020-08-11: qty 120

## 2020-08-11 MED ORDER — SOD CITRATE-CITRIC ACID 500-334 MG/5ML PO SOLN
30.0000 mL | ORAL | Status: DC | PRN
Start: 2020-08-11 — End: 2020-08-11

## 2020-08-11 MED ORDER — LACTATED RINGERS IV SOLN
500.0000 mL | INTRAVENOUS | Status: DC | PRN
  Administered 2020-08-11: 1000 mL via INTRAVENOUS

## 2020-08-11 MED ORDER — LIDOCAINE-EPINEPHRINE (PF) 1.5 %-1:200000 IJ SOLN
INTRAMUSCULAR | Status: DC | PRN
  Administered 2020-08-11: 3 mL via PERINEURAL

## 2020-08-11 MED ORDER — OXYTOCIN 10 UNIT/ML IJ SOLN
INTRAMUSCULAR | Status: AC
  Filled 2020-08-11: qty 2

## 2020-08-11 MED ORDER — OXYTOCIN BOLUS FROM INFUSION
333.0000 mL | Freq: Once | INTRAVENOUS | Status: AC
  Administered 2020-08-11: 333 mL via INTRAVENOUS

## 2020-08-11 MED ORDER — IBUPROFEN 600 MG PO TABS
ORAL_TABLET | ORAL | Status: AC
  Administered 2020-08-11: 600 mg via ORAL
  Filled 2020-08-11: qty 1

## 2020-08-11 MED ORDER — IBUPROFEN 600 MG PO TABS
600.0000 mg | ORAL_TABLET | Freq: Four times a day (QID) | ORAL | Status: DC
  Administered 2020-08-11 – 2020-08-13 (×7): 600 mg via ORAL
  Filled 2020-08-11 (×8): qty 1

## 2020-08-11 MED ORDER — LIDOCAINE HCL (PF) 1 % IJ SOLN
30.0000 mL | INTRAMUSCULAR | Status: AC | PRN
  Administered 2020-08-11: 30 mL via SUBCUTANEOUS

## 2020-08-11 MED ORDER — TERBUTALINE SULFATE 1 MG/ML IJ SOLN: 0.2500 mg | Freq: Once | INTRAMUSCULAR | Status: DC | PRN

## 2020-08-11 MED ORDER — PRENATAL MULTIVITAMIN CH
1.0000 | ORAL_TABLET | Freq: Every day | ORAL | Status: DC
  Administered 2020-08-12 – 2020-08-13 (×2): 1 via ORAL
  Filled 2020-08-11 (×2): qty 1

## 2020-08-11 MED ORDER — PHENYLEPHRINE 40 MCG/ML (10ML) SYRINGE FOR IV PUSH (FOR BLOOD PRESSURE SUPPORT)
80.0000 ug | PREFILLED_SYRINGE | Freq: Once | INTRAVENOUS | Status: AC | PRN
  Administered 2020-08-11: 100 ug via INTRAVENOUS

## 2020-08-11 MED ORDER — ACETAMINOPHEN 500 MG PO TABS
1000.0000 mg | ORAL_TABLET | Freq: Four times a day (QID) | ORAL | Status: DC
  Administered 2020-08-11 – 2020-08-12 (×2): 1000 mg via ORAL
  Filled 2020-08-11 (×3): qty 2

## 2020-08-11 MED ORDER — LIDOCAINE HCL (PF) 1 % IJ SOLN
INTRAMUSCULAR | Status: AC
  Filled 2020-08-11: qty 30

## 2020-08-11 MED ORDER — WITCH HAZEL-GLYCERIN EX PADS
1.0000 "application " | MEDICATED_PAD | CUTANEOUS | Status: DC | PRN
  Administered 2020-08-11 – 2020-08-13 (×2): 1 via TOPICAL
  Filled 2020-08-11 (×2): qty 100

## 2020-08-11 MED ORDER — OXYTOCIN-SODIUM CHLORIDE 30-0.9 UT/500ML-% IV SOLN
INTRAVENOUS | Status: AC
  Filled 2020-08-11: qty 1000

## 2020-08-11 MED ORDER — FENTANYL 2.5 MCG/ML W/ROPIVACAINE 0.15% IN NS 100 ML EPIDURAL (ARMC)
EPIDURAL | Status: DC | PRN
  Administered 2020-08-11: 12 mL/h via EPIDURAL

## 2020-08-11 MED ORDER — PHENYLEPHRINE 40 MCG/ML (10ML) SYRINGE FOR IV PUSH (FOR BLOOD PRESSURE SUPPORT): 80.0000 ug | PREFILLED_SYRINGE | Freq: Once | INTRAVENOUS | Status: AC | PRN

## 2020-08-11 MED ORDER — EPHEDRINE 5 MG/ML INJ
INTRAVENOUS | Status: AC
  Filled 2020-08-11: qty 4

## 2020-08-11 MED ORDER — MISOPROSTOL 200 MCG PO TABS
ORAL_TABLET | ORAL | Status: AC
  Filled 2020-08-11: qty 4

## 2020-08-11 MED ORDER — BUPIVACAINE HCL (PF) 0.25 % IJ SOLN
INTRAMUSCULAR | Status: DC | PRN
  Administered 2020-08-11: 7 mL via EPIDURAL

## 2020-08-11 MED ORDER — OXYTOCIN-SODIUM CHLORIDE 30-0.9 UT/500ML-% IV SOLN
1.0000 m[IU]/min | INTRAVENOUS | Status: DC
  Administered 2020-08-11: 2 m[IU]/min via INTRAVENOUS

## 2020-08-11 MED ORDER — LACTATED RINGERS IV SOLN: INTRAVENOUS | Status: DC

## 2020-08-11 MED ORDER — BUTORPHANOL TARTRATE 1 MG/ML IJ SOLN: 2.0000 mg | INTRAMUSCULAR | Status: DC | PRN

## 2020-08-11 MED ORDER — FENTANYL 2.5 MCG/ML W/ROPIVACAINE 0.15% IN NS 100 ML EPIDURAL (ARMC)
EPIDURAL | Status: AC
  Filled 2020-08-11: qty 100

## 2020-08-11 MED ORDER — AMMONIA AROMATIC IN INHA
RESPIRATORY_TRACT | Status: AC
  Filled 2020-08-11: qty 10

## 2020-08-11 MED ORDER — DIBUCAINE (PERIANAL) 1 % EX OINT: 1.0000 "application " | TOPICAL_OINTMENT | CUTANEOUS | Status: DC | PRN

## 2020-08-11 NOTE — Anesthesia Procedure Notes (Signed)
Epidural Patient location during procedure: OB Start time: 08/11/2020 8:45 AM End time: 08/11/2020 8:58 AM  Staffing Anesthesiologist: Yves Dill, MD Performed: anesthesiologist   Preanesthetic Checklist Completed: patient identified, IV checked, site marked, risks and benefits discussed, surgical consent, monitors and equipment checked, pre-op evaluation and timeout performed  Epidural Patient position: sitting Prep: Betadine Patient monitoring: heart rate, continuous pulse ox and blood pressure Approach: midline Location: L4-L5 Injection technique: LOR saline  Needle:  Needle type: Tuohy  Needle gauge: 17 G Needle length: 9 cm and 9 Catheter type: closed end flexible Catheter size: 20 Guage Test dose: negative and 1.5% lidocaine with Epi 1:200 K  Assessment Sensory level: T8 Events: blood not aspirated, injection not painful, no injection resistance, no paresthesia and negative IV test  Additional Notes Time outb                                Time out called.  Patient placed in sitting position.  Back prepped and draped in sterile fashion.  A skin wheal was placed in the L3=L4 interspace with 1% Lidocaine plain.  A 17G Tuohy needle was advanced into the epidural space by a loss of resistance technique.  The epidural catheter was threaded 3 cm and the TD was negative.  The patient tolerated the procedure well.Reason for block:procedure for pain

## 2020-08-11 NOTE — Discharge Summary (Signed)
Postpartum Discharge Summary  Date of Service updated  08/13/20     Patient Name: Deborah Mcfarland DOB: April 26, 2001 MRN: [id number]  Date of admission: 08/11/2020 Delivery date:08/11/2020  Delivering provider: Adrian Prows R  Date of discharge: 08/13/2020  Admitting diagnosis: ROM (rupture of membranes), premature [O42.90] Intrauterine pregnancy: [redacted]w[redacted]d     Secondary diagnosis:  Active Problems:   ROM (rupture of membranes), premature  Additional problems: None    Discharge diagnosis: Term Pregnancy Delivered                                              Post partum procedures:none Augmentation: Pitocin Complications: None  Hospital course: Onset of Labor With Vaginal Delivery      20 y.o. yo G1P1001 at [redacted]w[redacted]d was admitted in Latent Labor on 08/11/2020. Patient had an uncomplicated labor course as follows:  Membrane Rupture Time/Date: 4:25 AM ,08/11/2020   Delivery Method:Vaginal, Spontaneous  Episiotomy: None  Lacerations:  1st degree;Perineal  Patient had an uncomplicated postpartum course.  She is ambulating, tolerating a regular diet, passing flatus, and urinating well. Patient is discharged home in stable condition on 08/13/20.  Newborn Data: Birth date:08/11/2020  Birth time:2:20 PM  Gender:Female  Living status:Living  Apgars:8 ,9  Weight:2680 g   Magnesium Sulfate received: No BMZ received: No Rhophylac:No MMR:No T-DaP:declines Flu: N/A Transfusion:No  Physical exam  Vitals:   08/12/20 0819 08/12/20 1547 08/12/20 2323 08/13/20 0750  BP: 103/69 111/78 119/88 110/76  Pulse: 80 94 87 78  Resp: $Remo'20 20 18 20  'bDyoh$ Temp: 97.9 F (36.6 C) 98.2 F (36.8 C) 97.8 F (36.6 C) 97.9 F (36.6 C)  TempSrc: Oral Oral Oral Oral  SpO2: 99% 99% 95% 96%  Weight:      Height:       General: alert, cooperative and no distress Lochia: appropriate Uterine Fundus: firm Incision: N/A DVT Evaluation: No evidence of DVT seen on physical exam. Labs: Lab Results  Component  Value Date   WBC 16.8 (H) 08/12/2020   HGB 9.2 (L) 08/12/2020   HCT 27.8 (L) 08/12/2020   MCV 82.7 08/12/2020   PLT 123 (L) 08/12/2020   CMP Latest Ref Rng & Units 01/21/2020  Glucose 70 - 99 mg/dL 117(H)  BUN 6 - 20 mg/dL 12  Creatinine 0.44 - 1.00 mg/dL 0.78  Sodium 135 - 145 mmol/L 136  Potassium 3.5 - 5.1 mmol/L 3.5  Chloride 98 - 111 mmol/L 101  CO2 22 - 32 mmol/L 18(L)  Calcium 8.9 - 10.3 mg/dL 10.1  Total Protein 6.5 - 8.1 g/dL 8.3(H)  Total Bilirubin 0.3 - 1.2 mg/dL 1.2  Alkaline Phos 38 - 126 U/L 57  AST 15 - 41 U/L 37  ALT 0 - 44 U/L 41   Edinburgh Score: Edinburgh Postnatal Depression Scale Screening Tool 08/12/2020  I have been able to laugh and see the funny side of things. 0  I have looked forward with enjoyment to things. 0  I have blamed myself unnecessarily when things went wrong. 1  I have been anxious or worried for no good reason. 2  I have felt scared or panicky for no good reason. 0  Things have been getting on top of me. 0  I have been so unhappy that I have had difficulty sleeping. 0  I have felt sad or miserable. 0  I have been  so unhappy that I have been crying. 0  The thought of harming myself has occurred to me. 0  Edinburgh Postnatal Depression Scale Total 3      After visit meds:  Allergies as of 08/13/2020      Reactions   Ondansetron Hcl Nausea And Vomiting   Not an allergy      Medication List    STOP taking these medications   ondansetron 4 MG disintegrating tablet Commonly known as: Zofran ODT     TAKE these medications   Prenatal Vitamins 28-0.8 MG Tabs Take by mouth.        Discharge home in stable condition Infant Feeding: Breast Infant Disposition:home with mother Discharge instruction: per After Visit Summary and Postpartum booklet. Activity: Advance as tolerated. Pelvic rest for 6 weeks.  Diet: routine diet Anticipated Birth Control: None Postpartum Appointment:2 weeks Additional Postpartum F/U: 6 week Future  Appointments: Future Appointments  Date Time Provider [place]  08/13/2020  1:50 PM Rod Can, CNM [facility] None  08/20/2020  1:50 PM Rod Can, CNM [facility] None  08/27/2020  3:10 PM Schuman, Stefanie Libel, MD [facility] None   Follow up Visit:  Follow-up Information    Schuman, Christanna R, MD Follow up in 2 week(s).   Specialty: Obstetrics and Gynecology Contact information: [address] [place] [address] [phone]                   08/13/2020 Hoyt Koch, MD

## 2020-08-11 NOTE — Lactation Note (Signed)
This note was copied from a baby's chart. Lactation Consultation Note  Patient Name: Deborah Mcfarland SHFWY'O Date: 08/11/2020 Reason for consult: L&D Initial assessment Age:22 hours Lactation to the L&D room for initial visit. Deborah Mcfarland is holding the baby and attempting to latch with RN. LC turned the baby more skin to skin and was able to teacup the breast. Showed Deborah Mcfarland how to do hand expression and stoke the nipple nose to chin to illicit  A wide gape. Baby latched and had a rhythmic sucking pattern with swallows noted. Deborah Mcfarland has no further questions at this time.     Feeding Feeding Type: Breast Fed  LATCH Score Latch: Repeated attempts needed to sustain latch, nipple held in mouth throughout feeding, stimulation needed to elicit sucking reflex.  Audible Swallowing: A few with stimulation  Type of Nipple: Everted at rest and after stimulation  Comfort (Breast/Nipple): Soft / non-tender  Hold (Positioning): Assistance needed to correctly position infant at breast and maintain latch.  LATCH Score: 7  Interventions Interventions: Assisted with latch;Breast feeding basics reviewed;Hand express  Lactation Tools Discussed/Used     Consult Status Consult Status: Follow-up Date: 08/12/20 Follow-up type: Call as needed    Josip Merolla D Vesna Kable 08/11/2020, 5:10 PM

## 2020-08-11 NOTE — H&P (Signed)
Deborah Mcfarland is an 20 y.o. female.   Chief Complaint: leakage of amniotic fluid HPI: Patient presents this AM to triage. Reports that her water broke at home at 4:25 am. Reports large gush of fluid. She is also reporting contractions every 3-5 minutes. She denies vaginal bleeding. She reports normal fetal movements.   pregnancy 1 Problems (from 11/20/19 to present)    Problem Noted Resolved   Nausea and vomiting during pregnancy 01/11/2020 by Conard Novak, MD No   Supervision of other normal pregnancy, antepartum 01/09/2020 by Mirna Mires, CNM No   Overview Addendum 07/16/2020  2:35 PM by Natale Milch, MD     [facility] Prenatal Labs  Dating EDD by 7w u/s Blood type:   O+  Genetic Screen Declined Antibody: neg  Anatomic [place] Normal 04/16/20 Rubella:  immune  GTT Early:  NA              Third trimester: 161 3-hr: 90, 168, 167, 132 RPR:   non reactive  Flu vaccine Declines HBsAg:   neg  TDaP vaccine  Declines                                              Rhogam: not needed HIV: Non Reactive (02/21 0454)   Baby Food   Breast                                            GBS: (For PCN allergy, check sensitivities)  Contraception  Undecided Pap:  ASCUS, neg HPV  Circumcision    Pediatrician  Covid: unvaccinated,reports she has had covid twice.   Support Person             Previous Version       Past Medical History:  Diagnosis Date  . Acute pyelonephritis 09/11/2019  . Sepsis secondary to UTI (HCC) 09/11/2019    History reviewed. No pertinent surgical history.  No family history on file. Social History:  reports that she has been smoking e-cigarettes. She has never used smokeless tobacco. She reports that she does not drink alcohol and does not use drugs.  Allergies:  Allergies  Allergen Reactions  . Ondansetron Hcl Nausea And Vomiting    Not an allergy    Medications Prior to Admission  Medication Sig Dispense Refill  . ondansetron (ZOFRAN ODT) 4 MG  disintegrating tablet Take 1 tablet (4 mg total) by mouth every 6 (six) hours as needed for nausea. (Patient not taking: No sig reported) 30 tablet 2  . Prenatal Vit-Fe Fumarate-FA (PRENATAL VITAMINS) 28-0.8 MG TABS Take by mouth.      No results found for this or any previous visit (from the past 48 hour(s)). No results found.  Review of Systems  Constitutional: Negative for chills and fever.  HENT: Negative for congestion, hearing loss and sinus pain.   Respiratory: Negative for cough, shortness of breath and wheezing.   Cardiovascular: Negative for chest pain, palpitations and leg swelling.  Gastrointestinal: Negative for abdominal pain, constipation, diarrhea, nausea and vomiting.  Genitourinary: Negative for dysuria, flank pain, frequency, hematuria and urgency.  Musculoskeletal: Negative for back pain.  Skin: Negative for rash.  Neurological: Negative for dizziness and headaches.  Psychiatric/Behavioral: Negative for suicidal ideas. The patient  is not nervous/anxious.     Blood pressure (!) 131/92, pulse (!) 101, temperature 98.4 F (36.9 C), temperature source Oral, resp. rate 18, height 5\' 2"  (1.575 m), weight 78.5 kg, last menstrual period 11/20/2019, SpO2 99 %. Physical Exam Vitals and nursing note reviewed.  Constitutional:      Appearance: She is well-developed and well-nourished.  HENT:     Head: Normocephalic and atraumatic.  Cardiovascular:     Rate and Rhythm: Normal rate and regular rhythm.  Pulmonary:     Effort: Pulmonary effort is normal.     Breath sounds: Normal breath sounds.  Abdominal:     General: Bowel sounds are normal.     Palpations: Abdomen is soft.  Musculoskeletal:        General: Normal range of motion.  Skin:    General: Skin is warm and dry.  Neurological:     Mental Status: She is alert and oriented to person, place, and time.  Psychiatric:        Mood and Affect: Mood and affect normal.        Behavior: Behavior normal.        Thought  Content: Thought content normal.        Judgment: Judgment normal.    SVE per nursing: 4/50/-2  NST: 135 bpm baseline, moderste variability, 15x15 accelerations, no decelerations. Tocometer : every 3-5 minutes  Assessment/Plan 20 yo G1P0 [redacted]w[redacted]d with SROM 1. Will admit for labor.  2. GBS negative 3. Stadol or epidrual as desired for pain control 4. Labor admission labs   [redacted]w[redacted]d, MD 08/11/2020, 6:46 AM

## 2020-08-11 NOTE — Progress Notes (Signed)
Patient comfortable with epidural SVE: 5/90/-2 IUPC placed without issue Will start pitocin if contractions are inadequate.   NST: 130 bpm baseline, moderate variability, accelerations present, no decelerations. Tocometer : every 5 minutes  Adelene Idler MD, [facility], [facility] 08/11/2020 10:08 AM

## 2020-08-11 NOTE — OB Triage Note (Signed)
Pt is a G1 pt with SROM around 0425 this AM. Pt reports "huge gush" of fluid on BR floor.Pt also reports contractions about 3 minutes apart at this time.

## 2020-08-11 NOTE — Progress Notes (Signed)
Deborah Mcfarland is stable after vaginal  Delivery at 1420. w Pt's support person is at bedside. Pt bonding with infant and performing skin to skin after delivery. Epidural catheter removed by RN, tip intact, no bleeding noted at site. Pt is stable and ambulatory. Pt ambulated to the bathroom and voided. Pt tolerated activity. Pt transferred to mother/baby unit, RM 341 . Report given to Frann Rider, RN

## 2020-08-11 NOTE — Anesthesia Preprocedure Evaluation (Addendum)
Anesthesia Evaluation  Patient identified by MRN, date of birth, ID band Patient awake    Reviewed: Allergy & Precautions, NPO status , Patient's Chart, lab work & pertinent test results  Airway Mallampati: II  TM Distance: >3 FB     Dental   Pulmonary Current Smoker,    Pulmonary exam normal        Cardiovascular negative cardio ROS Normal cardiovascular exam     Neuro/Psych negative neurological ROS  negative psych ROS   GI/Hepatic negative GI ROS, Neg liver ROS,   Endo/Other  negative endocrine ROS  Renal/GU   negative genitourinary   Musculoskeletal   Abdominal Normal abdominal exam  (+)   Peds negative pediatric ROS (+)  Hematology negative hematology ROS (+)   Anesthesia Other Findings   Reproductive/Obstetrics (+) Pregnancy                             Anesthesia Physical Anesthesia Plan  ASA: II  Anesthesia Plan: Epidural   Post-op Pain Management:    Induction:   PONV Risk Score and Plan:   Airway Management Planned: Natural Airway  Additional Equipment:   Intra-op Plan:   Post-operative Plan:   Informed Consent: I have reviewed the patients History and Physical, chart, labs and discussed the procedure including the risks, benefits and alternatives for the proposed anesthesia with the patient or authorized representative who has indicated his/her understanding and acceptance.     Dental advisory given  Plan Discussed with: CRNA and Surgeon  Anesthesia Plan Comments:         Anesthesia Quick Evaluation

## 2020-08-11 NOTE — Progress Notes (Signed)
Patient requested to have IV in right hand removed and a new peripheral IV started. No issues with right hand IV, it was infusing well, no pain or redness, patient states that it is just not in a "good spot" because that is her dominant hand. New IV started on left anterior forearm by L. Elks, RN.

## 2020-08-12 LAB — CBC
HCT: 27.8 % — ABNORMAL LOW (ref 36.0–46.0)
Hemoglobin: 9.2 g/dL — ABNORMAL LOW (ref 12.0–15.0)
MCH: 27.4 pg (ref 26.0–34.0)
MCHC: 33.1 g/dL (ref 30.0–36.0)
MCV: 82.7 fL (ref 80.0–100.0)
Platelets: 123 10*3/uL — ABNORMAL LOW (ref 150–400)
RBC: 3.36 MIL/uL — ABNORMAL LOW (ref 3.87–5.11)
RDW: 14.1 % (ref 11.5–15.5)
WBC: 16.8 10*3/uL — ABNORMAL HIGH (ref 4.0–10.5)
nRBC: 0 % (ref 0.0–0.2)

## 2020-08-12 LAB — RPR: RPR Ser Ql: NONREACTIVE

## 2020-08-12 MED ORDER — ACETAMINOPHEN 500 MG PO TABS
1000.0000 mg | ORAL_TABLET | Freq: Four times a day (QID) | ORAL | Status: DC
  Administered 2020-08-12 – 2020-08-13 (×4): 1000 mg via ORAL
  Filled 2020-08-12 (×4): qty 2

## 2020-08-12 MED ORDER — CALCIUM CARBONATE ANTACID 500 MG PO CHEW
2.0000 | CHEWABLE_TABLET | ORAL | Status: DC | PRN
  Administered 2020-08-12 – 2020-08-13 (×3): 400 mg via ORAL
  Filled 2020-08-12 (×2): qty 2

## 2020-08-12 NOTE — Anesthesia Postprocedure Evaluation (Signed)
Anesthesia Post Note  Patient: Deborah Mcfarland  Procedure(s) Performed: AN AD HOC LABOR EPIDURAL  Patient location during evaluation: Mother Baby Anesthesia Type: Epidural Level of consciousness: awake and alert Pain management: pain level controlled Vital Signs Assessment: post-procedure vital signs reviewed and stable Respiratory status: spontaneous breathing, nonlabored ventilation and respiratory function stable Cardiovascular status: stable Postop Assessment: no headache, no backache and epidural receding Anesthetic complications: no   No complications documented.   Last Vitals:  Vitals:   08/11/20 2227 08/12/20 0354  BP: 118/84 105/63  Pulse: 88 69  Resp: 20 20  Temp: (!) 36.4 C 36.4 C  SpO2: 98% 98%    Last Pain:  Vitals:   08/12/20 0600  TempSrc:   PainSc: 4                  Jules Schick

## 2020-08-12 NOTE — Progress Notes (Signed)
Post Partum Day 0 Subjective: no complaints, up ad lib, voiding, tolerating PO and she reports that breastfeeding si going well. some difficulty getting the baby to latch on her left breast, but she is making progress.  Objective: Blood pressure 103/69, pulse 80, temperature 97.9 F (36.6 C), temperature source Oral, resp. rate 20, height 5\' 2"  (1.575 m), weight 78.5 kg, last menstrual period 11/20/2019, SpO2 99 %, unknown if currently breastfeeding.  Physical Exam:  General: alert, cooperative, appears stated age, no distress and mildly obese Lochia: appropriate Uterine Fundus: firm Incision: healing well DVT Evaluation: No evidence of DVT seen on physical exam. Negative Homan's sign.  Recent Labs    08/11/20 0652 08/12/20 0436  HGB 10.5* 9.2*  HCT 31.6* 27.8*    Assessment/Plan: Plan for discharge tomorrow, Lactation consult and Contraception undecided . Will send her home tomorrow on iron daily.   LOS: 1 day   08/14/20 08/12/2020, 9:08 AM

## 2020-08-12 NOTE — Lactation Note (Signed)
This note was copied from a baby's chart. Lactation Consultation Note  Patient Name: Deborah Mcfarland JSHFW'Y Date: 08/12/2020 Reason for consult: Follow-up assessment;1st time breastfeeding;Early term 37-38.6wks;Infant < 6lbs Age:22 hours  Lactation follow-up. BF overnight, and just finished a feed, weight and void/stools exceeds expectations for HOL and gestational age. No pain/discomfort when feeding per mom, has worked with Charity fundraiser for positioning tips overnight, and thinks baby is doing better. LC reviewed feeding patterns for HOL and 37wk infants, encouraged to keep baby awake at the breast during feedings- tips given, reviewed output expectations, hand expression, and skin to skin. Encouraged to call with next feeding for LC observation. Support person had questions re: obtaining a pump for home; options reviewed. Whiteboard updated with contact number.  Maternal Data Formula Feeding for Exclusion: No Has patient been taught Hand Expression?: Yes Does the patient have breastfeeding experience prior to this delivery?: No  Feeding Feeding Type: Breast Fed  LATCH Score                   Interventions Interventions: Breast feeding basics reviewed  Lactation Tools Discussed/Used WIC Program: No (did not qualify per mom)   Consult Status Consult Status: Follow-up Date: 08/12/20 Follow-up type: Call as needed    Danford Bad 08/12/2020, 9:25 AM

## 2020-08-13 ENCOUNTER — Encounter: Payer: Self-pay | Admitting: Advanced Practice Midwife

## 2020-08-13 NOTE — Progress Notes (Signed)
Admit Date: 08/11/2020 Today's Date: 08/13/2020  Post Partum Day 2  Subjective:  no complaints  Objective: Temp:  [97.8 F (36.6 C)-98.2 F (36.8 C)] 97.9 F (36.6 C) (01/25 0750) Pulse Rate:  [78-94] 78 (01/25 0750) Resp:  [18-20] 20 (01/25 0750) BP: (110-119)/(76-88) 110/76 (01/25 0750) SpO2:  [95 %-99 %] 96 % (01/25 0750)  Physical Exam:  General: alert, cooperative, appears stated age and no distress Lochia: appropriate Uterine Fundus: firm Incision: none DVT Evaluation: No evidence of DVT seen on physical exam.  Recent Labs    08/11/20 0652 08/12/20 0436  HGB 10.5* 9.2*  HCT 31.6* 27.8*    Assessment/Plan: Discharge home, Breastfeeding, Contraception (discussed IUD as option, to consider) and Infant doing well   LOS: 2 days   Deborah Mcfarland [facility] 08/13/2020, 9:47 AM

## 2020-08-13 NOTE — Progress Notes (Signed)
Discharge order received from doctor. Reviewed discharge instructions and medications with patient and answered all questions. Follow up appointment instructions given. Patient verbalized understanding. ID bands checked. Patient discharged home with infant via wheelchair by nursing/auxillary.    Harrie Cazarez, RN  

## 2020-08-13 NOTE — Lactation Note (Signed)
This note was copied from a baby's chart. Lactation Consultation Note  Patient Name: Deborah Mcfarland NOBSJ'G Date: 08/13/2020 Reason for consult: Follow-up assessment;1st time breastfeeding;Early term 37-38.6wks;Infant < 6lbs Age:22 hours  Lactation follow-up before anticipated discharge. 37wk infant has an elevated bilirubin, however void/stools are above expectations for HOL.  LC and LC student present in room. LC student provided discharge education re: BF basics and what to expect in days to come. Education given for normal 37wk infant feeding and sleeping behaviors, and recommendation of hand expression/hand pump post feeds for additional milk removal and offering via cup/spoon/bottle for supplement as needed. Mom encouraged to wake/feed baby every 2-3 hours due to weeks of gestation and elevated bili levels. Pacifier questions addressed: impact this may have on feeding cues and frequency, and latch at the breast.  Breast fullness and engorgement reviewed, differences of both and how to manage. Nipple care tips given.  Mom planning to return to school in 6-8 weeks; tips for pump introduction given as well as paced-bottle feeding. Information for outpatient lactation services and community breastfeeding support provided. Encouraged to call with questions/concerns or appointment.  Maternal Data Formula Feeding for Exclusion: No Has patient been taught Hand Expression?: Yes Does the patient have breastfeeding experience prior to this delivery?: No  Feeding    LATCH Score                   Interventions Interventions: Breast feeding basics reviewed;Hand pump;Hand express  Lactation Tools Discussed/Used WIC Program: No ("didn't qualify")   Consult Status Consult Status: Complete Date: 08/13/20 Follow-up type: Call as needed    Danford Bad 08/13/2020, 10:32 AM

## 2020-08-13 NOTE — Discharge Instructions (Signed)

## 2020-08-16 ENCOUNTER — Ambulatory Visit: Payer: Self-pay

## 2020-08-16 NOTE — Lactation Note (Addendum)
This note was copied from a baby's chart. Lactation Consultation Note  Patient Name: Deborah Mcfarland ANVBT'Y Date: 08/16/2020 Reason for consult: Follow-up assessment Age:22 days  [facility] consult on Pediatric Unit.  37 weeks.  < 5 lbs.  Admitted for hyperbilirubinemia.  Baby on double phototherapy and bilirubin levels decreased this morning.  Frequent stools and voids noted now.   Mother is pumping approx 3-6 oz per pumping session. Mother is breastfeeding baby will latch and supplementing after with breastmilk volumes of 1-1.5 oz. Attempted latching but baby is sleepy.  Mother gave baby approx 30 ml.   Discussed paced feeding and increasing volumes per day of life and as baby desires.  Mother does not have DEBP at home.  Mother does have 2 manual pumps.   Discussed calling insurance today to get DEBP.  Also suggest checking with gift shop later today for rental.   Encouraged mother to continue to attempt breastfeeding and supplement until she is latching well.  Baby will become more awake at the breast as bilirubin declines.   Answered all questions. Provided lactation phone number for further questions and support group.     Feeding Feeding Type: Bottle Fed - Breast Milk  LInterventions Interventions: DEBP  Lactation Tools Discussed/Used  DEBP  Consult Status Consult Status: Complete Date: 08/16/20   Dahlia Byes Boschen RN IBCLC 08/16/2020, 9:06 AM

## 2020-08-20 ENCOUNTER — Encounter: Payer: Self-pay | Admitting: Advanced Practice Midwife

## 2020-08-27 ENCOUNTER — Encounter: Payer: Self-pay | Admitting: Obstetrics and Gynecology

## 2020-08-27 ENCOUNTER — Ambulatory Visit: Payer: Self-pay | Admitting: Obstetrics and Gynecology
